# Patient Record
Sex: Female | Born: 1970 | Race: Asian | Hispanic: No | Smoking: Never smoker
Health system: Southern US, Community
[De-identification: ages and names within clinical notes are randomized; demographics above are authoritative.]

## PROBLEM LIST (undated history)

## (undated) ENCOUNTER — Inpatient Hospital Stay (HOSPITAL_COMMUNITY): Payer: Self-pay

## (undated) DIAGNOSIS — Z789 Other specified health status: Secondary | ICD-10-CM

## (undated) HISTORY — PX: OTHER SURGICAL HISTORY: SHX169

---

## 1998-08-26 ENCOUNTER — Emergency Department (HOSPITAL_COMMUNITY): Admission: EM | Admit: 1998-08-26 | Discharge: 1998-08-26 | Payer: Self-pay | Admitting: *Deleted

## 1999-08-10 ENCOUNTER — Inpatient Hospital Stay (HOSPITAL_COMMUNITY): Admission: AD | Admit: 1999-08-10 | Discharge: 1999-08-10 | Payer: Self-pay | Admitting: Obstetrics & Gynecology

## 1999-10-10 ENCOUNTER — Inpatient Hospital Stay (HOSPITAL_COMMUNITY): Admission: AD | Admit: 1999-10-10 | Discharge: 1999-10-10 | Payer: Self-pay | Admitting: Obstetrics

## 2000-01-05 ENCOUNTER — Inpatient Hospital Stay (HOSPITAL_COMMUNITY): Admission: EM | Admit: 2000-01-05 | Discharge: 2000-01-05 | Payer: Self-pay | Admitting: Obstetrics

## 2000-01-21 ENCOUNTER — Other Ambulatory Visit: Admission: RE | Admit: 2000-01-21 | Discharge: 2000-01-21 | Payer: Self-pay | Admitting: Family Medicine

## 2000-01-31 ENCOUNTER — Inpatient Hospital Stay (HOSPITAL_COMMUNITY): Admission: AD | Admit: 2000-01-31 | Discharge: 2000-01-31 | Payer: Self-pay | Admitting: Obstetrics & Gynecology

## 2000-03-01 ENCOUNTER — Other Ambulatory Visit: Admission: RE | Admit: 2000-03-01 | Discharge: 2000-03-01 | Payer: Self-pay | Admitting: *Deleted

## 2000-08-17 ENCOUNTER — Inpatient Hospital Stay (HOSPITAL_COMMUNITY): Admission: AD | Admit: 2000-08-17 | Discharge: 2000-08-19 | Payer: Self-pay | Admitting: Obstetrics and Gynecology

## 2000-12-31 ENCOUNTER — Other Ambulatory Visit: Admission: RE | Admit: 2000-12-31 | Discharge: 2000-12-31 | Payer: Self-pay | Admitting: Obstetrics and Gynecology

## 2001-12-23 ENCOUNTER — Other Ambulatory Visit: Admission: RE | Admit: 2001-12-23 | Discharge: 2001-12-23 | Payer: Self-pay | Admitting: Obstetrics and Gynecology

## 2003-03-09 ENCOUNTER — Other Ambulatory Visit: Admission: RE | Admit: 2003-03-09 | Discharge: 2003-03-09 | Payer: Self-pay | Admitting: Obstetrics and Gynecology

## 2003-11-23 ENCOUNTER — Emergency Department (HOSPITAL_COMMUNITY): Admission: EM | Admit: 2003-11-23 | Discharge: 2003-11-23 | Payer: Self-pay | Admitting: Family Medicine

## 2004-05-08 ENCOUNTER — Other Ambulatory Visit: Admission: RE | Admit: 2004-05-08 | Discharge: 2004-05-08 | Payer: Self-pay | Admitting: Obstetrics and Gynecology

## 2004-06-11 ENCOUNTER — Ambulatory Visit (HOSPITAL_COMMUNITY): Admission: RE | Admit: 2004-06-11 | Discharge: 2004-06-11 | Payer: Self-pay | Admitting: *Deleted

## 2004-08-10 ENCOUNTER — Inpatient Hospital Stay (HOSPITAL_COMMUNITY): Admission: AD | Admit: 2004-08-10 | Discharge: 2004-08-14 | Payer: Self-pay | Admitting: Obstetrics & Gynecology

## 2004-08-10 ENCOUNTER — Ambulatory Visit: Payer: Self-pay | Admitting: Obstetrics & Gynecology

## 2004-08-20 ENCOUNTER — Ambulatory Visit: Payer: Self-pay | Admitting: *Deleted

## 2004-08-26 ENCOUNTER — Inpatient Hospital Stay (HOSPITAL_COMMUNITY): Admission: AD | Admit: 2004-08-26 | Discharge: 2004-08-26 | Payer: Self-pay | Admitting: *Deleted

## 2004-08-29 ENCOUNTER — Inpatient Hospital Stay (HOSPITAL_COMMUNITY): Admission: AD | Admit: 2004-08-29 | Discharge: 2004-08-30 | Payer: Self-pay | Admitting: *Deleted

## 2004-09-03 ENCOUNTER — Ambulatory Visit: Payer: Self-pay | Admitting: *Deleted

## 2004-09-10 ENCOUNTER — Ambulatory Visit: Payer: Self-pay | Admitting: *Deleted

## 2004-09-17 ENCOUNTER — Inpatient Hospital Stay (HOSPITAL_COMMUNITY): Admission: AD | Admit: 2004-09-17 | Discharge: 2004-09-18 | Payer: Self-pay | Admitting: Obstetrics and Gynecology

## 2004-09-17 ENCOUNTER — Ambulatory Visit: Payer: Self-pay | Admitting: *Deleted

## 2004-09-24 ENCOUNTER — Ambulatory Visit: Payer: Self-pay | Admitting: *Deleted

## 2004-10-01 ENCOUNTER — Ambulatory Visit: Payer: Self-pay | Admitting: *Deleted

## 2004-10-08 ENCOUNTER — Ambulatory Visit: Payer: Self-pay | Admitting: Obstetrics & Gynecology

## 2004-10-22 ENCOUNTER — Ambulatory Visit: Payer: Self-pay | Admitting: *Deleted

## 2004-10-29 ENCOUNTER — Ambulatory Visit: Payer: Self-pay | Admitting: *Deleted

## 2004-11-05 ENCOUNTER — Ambulatory Visit: Payer: Self-pay | Admitting: *Deleted

## 2004-11-06 ENCOUNTER — Inpatient Hospital Stay (HOSPITAL_COMMUNITY): Admission: AD | Admit: 2004-11-06 | Discharge: 2004-11-08 | Payer: Self-pay | Admitting: Obstetrics and Gynecology

## 2004-11-06 ENCOUNTER — Ambulatory Visit: Payer: Self-pay | Admitting: Obstetrics and Gynecology

## 2009-08-09 ENCOUNTER — Encounter: Admission: RE | Admit: 2009-08-09 | Discharge: 2009-08-09 | Payer: Self-pay | Admitting: Family Medicine

## 2009-09-05 ENCOUNTER — Ambulatory Visit (HOSPITAL_COMMUNITY): Admission: RE | Admit: 2009-09-05 | Discharge: 2009-09-05 | Payer: Self-pay | Admitting: Gastroenterology

## 2010-02-09 ENCOUNTER — Emergency Department (HOSPITAL_COMMUNITY): Admission: EM | Admit: 2010-02-09 | Discharge: 2010-02-10 | Payer: Self-pay | Admitting: Emergency Medicine

## 2010-03-19 ENCOUNTER — Ambulatory Visit (HOSPITAL_COMMUNITY): Admission: RE | Admit: 2010-03-19 | Discharge: 2010-03-19 | Payer: Self-pay | Admitting: Surgery

## 2010-09-18 LAB — CBC
HCT: 38.9 % (ref 36.0–46.0)
Hemoglobin: 13.2 g/dL (ref 12.0–15.0)
MCH: 23.3 pg — ABNORMAL LOW (ref 26.0–34.0)
MCHC: 33.9 g/dL (ref 30.0–36.0)
MCV: 68.6 fL — ABNORMAL LOW (ref 78.0–100.0)
Platelets: 259 10*3/uL (ref 150–400)
RBC: 5.67 MIL/uL — ABNORMAL HIGH (ref 3.87–5.11)
RDW: 15.4 % (ref 11.5–15.5)
WBC: 7.9 10*3/uL (ref 4.0–10.5)

## 2010-09-18 LAB — COMPREHENSIVE METABOLIC PANEL
ALT: 18 U/L (ref 0–35)
AST: 18 U/L (ref 0–37)
Albumin: 4.1 g/dL (ref 3.5–5.2)
Alkaline Phosphatase: 39 U/L (ref 39–117)
BUN: 6 mg/dL (ref 6–23)
CO2: 27 mEq/L (ref 19–32)
Calcium: 9.5 mg/dL (ref 8.4–10.5)
Chloride: 106 mEq/L (ref 96–112)
Creatinine, Ser: 0.56 mg/dL (ref 0.4–1.2)
GFR calc Af Amer: >60 mL/min (ref >60)
GFR calc non Af Amer: >60 mL/min (ref >60)
Glucose, Bld: 83 mg/dL (ref 70–99)
Potassium: 4 mEq/L (ref 3.5–5.1)
Sodium: 139 mEq/L (ref 135–145)
Total Bilirubin: 1 mg/dL (ref 0.3–1.2)
Total Protein: 7 g/dL (ref 6.0–8.3)

## 2010-09-18 LAB — SURGICAL PCR SCREEN
MRSA, PCR: NEGATIVE
Staphylococcus aureus: NEGATIVE

## 2010-09-19 LAB — CBC
HCT: 34.7 % — ABNORMAL LOW (ref 36.0–46.0)
Hemoglobin: 11.3 g/dL — ABNORMAL LOW (ref 12.0–15.0)
MCH: 22.4 pg — ABNORMAL LOW (ref 26.0–34.0)
MCHC: 32.6 g/dL (ref 30.0–36.0)
MCV: 68.8 fL — ABNORMAL LOW (ref 78.0–100.0)
Platelets: 271 10*3/uL (ref 150–400)
RBC: 5.04 MIL/uL (ref 3.87–5.11)
RDW: 15.4 % (ref 11.5–15.5)
WBC: 8.3 10*3/uL (ref 4.0–10.5)

## 2010-09-19 LAB — COMPREHENSIVE METABOLIC PANEL
ALT: 27 U/L (ref 0–35)
AST: 28 U/L (ref 0–37)
Albumin: 3.7 g/dL (ref 3.5–5.2)
Alkaline Phosphatase: 30 U/L — ABNORMAL LOW (ref 39–117)
BUN: 8 mg/dL (ref 6–23)
CO2: 27 mEq/L (ref 19–32)
Calcium: 8.8 mg/dL (ref 8.4–10.5)
Chloride: 107 mEq/L (ref 96–112)
Creatinine, Ser: 0.71 mg/dL (ref 0.4–1.2)
GFR calc Af Amer: >60 mL/min (ref >60)
GFR calc non Af Amer: >60 mL/min (ref >60)
Glucose, Bld: 99 mg/dL (ref 70–99)
Potassium: 3.2 mEq/L — ABNORMAL LOW (ref 3.5–5.1)
Sodium: 139 mEq/L (ref 135–145)
Total Bilirubin: 0.4 mg/dL (ref 0.3–1.2)
Total Protein: 6.7 g/dL (ref 6.0–8.3)

## 2010-09-19 LAB — DIFFERENTIAL
Basophils Absolute: 0 10*3/uL (ref 0.0–0.1)
Basophils Relative: 0 % (ref 0–1)
Eosinophils Absolute: 0.1 10*3/uL (ref 0.0–0.7)
Eosinophils Relative: 1 % (ref 0–5)
Lymphocytes Relative: 38 % (ref 12–46)
Lymphs Abs: 3.2 10*3/uL (ref 0.7–4.0)
Monocytes Absolute: 0.7 10*3/uL (ref 0.1–1.0)
Monocytes Relative: 8 % (ref 3–12)
Neutro Abs: 4.3 10*3/uL (ref 1.7–7.7)
Neutrophils Relative %: 53 % (ref 43–77)

## 2010-09-19 LAB — URINE MICROSCOPIC-ADD ON

## 2010-09-19 LAB — URINALYSIS, ROUTINE W REFLEX MICROSCOPIC
Bilirubin Urine: NEGATIVE
Glucose, UA: NEGATIVE mg/dL
Hgb urine dipstick: NEGATIVE
Ketones, ur: NEGATIVE mg/dL
Leukocytes, UA: NEGATIVE
Nitrite: NEGATIVE
Protein, ur: NEGATIVE mg/dL
Specific Gravity, Urine: 1.019 (ref 1.005–1.030)
Urobilinogen, UA: 1 mg/dL (ref 0.0–1.0)
pH: 7.5 (ref 5.0–8.0)

## 2010-09-19 LAB — POCT PREGNANCY, URINE: Preg Test, Ur: NEGATIVE

## 2010-09-19 LAB — LIPASE, BLOOD: Lipase: 30 U/L (ref 11–59)

## 2010-11-10 ENCOUNTER — Other Ambulatory Visit: Payer: Self-pay | Admitting: Family Medicine

## 2010-11-10 DIAGNOSIS — N644 Mastodynia: Secondary | ICD-10-CM

## 2010-11-13 ENCOUNTER — Ambulatory Visit
Admission: RE | Admit: 2010-11-13 | Discharge: 2010-11-13 | Disposition: A | Discharge status: Home / Self Care | Payer: Self-pay | Source: Ambulatory Visit | Attending: Family Medicine | Admitting: Family Medicine

## 2010-11-13 DIAGNOSIS — N644 Mastodynia: Secondary | ICD-10-CM

## 2010-11-21 NOTE — H&P (Signed)
[facility]  Patient:    Carolyn Bryan, Carolyn Bryan                         MRN: [id number] Adm. Date:  [id number] Attending:  Shaune Spittle Dictator:   Wynelle Bourgeois, CNM                         History and Physical  HISTORY OF PRESENT ILLNESS:   Ms. Lucente is a 40 year old G4, para 2-0-1-2 at 39+ weeks who presents to MAU with complaints of one hours duration of strong uterine contractions every few minutes.  Upon exam in MAU, she was found to be 9-cm dilated and was transferred to the LDR.  She denied any leaking and reported some bloody show and positive fetal movement.  Pregnancy has been followed by the M.D. service at [facility] and has been remarkable for: #1 - Previous C-section with subsequent VBAC x 1, #2 - unknown LMP, #3 - first trimester bleeding, #4 - group B strep negative, #5 - late to care.  OBSTETRICAL HISTORY:          Her OB history is remarkable for a C-section in March of 1992 of a female infant under general anesthesia at [redacted] weeks gestation secondary to cephalopelvic disproportion.  She had a vaginal birth after cesarean section in July of 1994 of a female at [redacted] weeks gestation with no complications.  She had a spontaneous abortion in 1998 at [redacted] weeks gestation with no complications.  PRENATAL LABORATORY DATA:     Hemoglobin 11.4, hematocrit 36.4, platelets 283,000.  Blood type A positive.  Antibody screen negative.  Sickle cell negative.  RPR nonreactive.  Rubella immune.  HBsAg negative.  Pap test normal.  Gonorrhea negative.  Chlamydia negative.  Glucose challenge within normal limits.  Group B strep negative.  MEDICAL HISTORY:              Her medical history is remarkable for anemia in 1992, a mole removal on her right breast in 1991, occasional yeast infections and history of varicella at age 28.  FAMILY HISTORY:               She has no knowledge of her family history due to being raised in an orphanage.  GENETIC HISTORY:               Her genetic history is unavailable secondary to unknown history of patients heritage and the unknown history of the father of the baby.  SOCIAL HISTORY:               Patient is separated but involved with a young man named Yetta Barre who is involved and supportive.  She does not report a religious affiliation.  She denies any alcohol, tobacco or drug use.  PHYSICAL EXAMINATION  VITAL SIGNS:                  Stable.  Afebrile.  NEUROLOGIC:                   Intact.  HEENT:                        Within normal limits.  NECK:                         Thyroid normal, not enlarged.  BREASTS:  Soft, nontender.  No masses.  CARDIOVASCULAR:               Regular rate and rhythm.  RESPIRATORY:                  Clear to auscultation bilaterally.  ABDOMEN:                      Gravid.  Fetal monitor denotes fetal heart rate in the 120s with variable decelerations to the 80s with contractions.  Uterine contractions every one to two minutes.  PELVIC:                       Cervical exam on admission was 9-cm dilated, 100% effaced and 0 station with a vertex presentation.  EXTREMITIES:                  Within normal limits.  ASSESSMENT:                   1. Intrauterine pregnancy at 39+ weeks.                               2. Active labor, now delivered status post                                  vaginal delivery by Dr. Maris Berger. Haygood of                                  a viable female infant named [place], Apgars                                  9/9, weight unknown at this time.                               3. Group B streptococcus negative.                               4. Previous cesarean section with vaginal birth                                  after cesarean section x 2.  PLAN:                         1. Admit to birthing suites per Dr. Pennie Rushing.                               2. Routine M.D. orders, routine postpartum                                   orders.                               3. Transfer to mother/baby unit when stable. DD:  08/17/00 TD:  08/17/00 Job: [id number]

## 2010-11-21 NOTE — Discharge Summary (Signed)
[name], Bryan                  ACCOUNT NO.:  [id number]   MEDICAL RECORD NO.:  [id number]          PATIENT TYPE:  INP   LOCATION:  9154                          FACILITY:  [facility]   PHYSICIAN:  Lesly Dukes, M.D. DATE OF BIRTH:  [birth year]   DATE OF ADMISSION:  08/10/2004  DATE OF DISCHARGE:  08/14/2004                                 DISCHARGE SUMMARY   ADMISSION DIAGNOSES:  43.  A 40 year old G6, P3-0-2-3 at 26-2/7 weeks with decreased fetal movement      and cramping pain.  2.  Cervical change to a loose 1, 50% with developed lower uterine segment.  3.  History of cesarean section with two vaginal births after the cesarean      section, all at full term.   DISCHARGE DIAGNOSES:  29.  A 40 year old, G6, P3-0-2-3 at 27-1/7 weeks with stable preterm cervical      change.  2.  Cervical exam of 1 cm, approximately 2.5 cm long, and -2.  3.  Reassuring fetal heart tracing.  4.  Elevated one hour Glucola.   DISCHARGE MEDICATIONS:  1.  Procardia XL 30 mg one p.o. b.i.d.  2.  Prenatal vitamin one p.o. q day.   ADMISSION HISTORY:  [name] was admitted to [facility] with preterm  cervical change and some cramping.  She was started on magnesium until her  betamethasone x2 doses was given.  The magnesium was discontinued and the  patient was changed to Procardia on the day prior to discharge.  She was  stable throughout her hospitalization as far as contractions or cervical  change.  On the day of discharge, her cervix had made no change.  She  received five days of Unasyn for GBS positive status.  Her fetal fibronectin  was negative.   A one-hour Glucola was done prior to betamethasone.  It was elevated at 150.  She was placed on an ADA diet and her sugars were well controlled with a.m.  sugars of 90 and two hours postprandial between 90-120.  She was arranged  for a three hours GTT after discharge and was told of the appropriate diet  as well as to fast prior to the exam.   The patient was discharged to home in stable condition.  She will [facility] one [facility].   INSTRUCTIONS GIVEN TO THE PATIENT:  The patient was told of the above  medical regimen.  She was given the appropriate diet instructions prior to  her three hours GTT and was told to fast after midnight before the test.  She was given the address for [facility].  She was given preterm labor  precautions as well.    LC/MEDQ  D:  08/14/2004  T:  08/14/2004  Job:  [id number]   cc:   [facility]

## 2010-11-21 NOTE — Discharge Summary (Signed)
NAMEWING, GFELLER                  ACCOUNT NO.:  [id number]   MEDICAL RECORD NO.:  [id number]          PATIENT TYPE:  WOC   LOCATION:  WOC                          FACILITY:  [facility]   PHYSICIAN:  Conni Elliot, M.D.DATE OF BIRTH:  07-19-1970   DATE OF ADMISSION:  09/17/2004  DATE OF DISCHARGE:  09/18/2004                                 DISCHARGE SUMMARY   ADMISSION DIAGNOSES:  76.  40 year old, G6, P3-0-2-3 at 1 and 2 weeks with preterm cervical      change.  2.  Reassuring fetal heart tracing.   DISCHARGE DIAGNOSES:  2.  40 year old, G6, P3-0-2-3 at 21 and 3 with stable cervical exam.  2.  Reassuring fetal heart tracing.   DISCHARGE MEDICATIONS:  1.  Procardia XL 30 mg p.o. b.i.d.  2.  Prenatal vitamins 1 p.o. q.d.   ADMISSION HISTORY:  Ms. Tonnesen was [facility] from [facility] for cervical  change. She was noted to be fingertip a week prior to admission which was  improved from a cervical exam of 2 cm during her last admission. On day of  admission, she was again 2 cm in dilation.   HOSPITAL COURSE:  The patient remained stable overnight. She was continued  on Procardia and started on Unasyn in case she labored. She was given  betamethasone during her last hospitalization. Throughout the hospital stay,  the fetal heart tracing remained reassuring. On her evening of discharge,  her cervix was 2, 50 and -2 and vertex which was no significant change from  the day prior.   CONDITION ON DISCHARGE:  The patient was discharged to home in stable  condition.   DISCHARGE INSTRUCTIONS:  The patient was told of the above medical regimen.  She should followup with [facility] at her next scheduled appointment  or next week. She does have a GBS, gonorrhea and chlamydia that are pending  on discharge and these will be followed up as an outpatient.      LC/MEDQ  D:  09/18/2004  T:  09/18/2004  Job:  [id number]

## 2012-07-20 ENCOUNTER — Other Ambulatory Visit (HOSPITAL_COMMUNITY): Payer: Self-pay | Admitting: Family Medicine

## 2012-07-20 DIAGNOSIS — Z1231 Encounter for screening mammogram for malignant neoplasm of breast: Secondary | ICD-10-CM

## 2012-08-01 ENCOUNTER — Ambulatory Visit (HOSPITAL_COMMUNITY)
Admission: RE | Admit: 2012-08-01 | Discharge: 2012-08-01 | Disposition: A | Discharge status: Home / Self Care | Payer: Self-pay | Source: Ambulatory Visit | Attending: Family Medicine | Admitting: Family Medicine

## 2012-08-01 DIAGNOSIS — Z1231 Encounter for screening mammogram for malignant neoplasm of breast: Secondary | ICD-10-CM

## 2012-09-19 ENCOUNTER — Encounter: Payer: Self-pay | Admitting: Family Medicine

## 2012-09-19 ENCOUNTER — Encounter: Payer: Self-pay | Admitting: Obstetrics and Gynecology

## 2012-09-19 ENCOUNTER — Ambulatory Visit: Payer: Self-pay | Admitting: Family Medicine

## 2012-09-19 VITALS — BP 130/64 | Ht 62.0 in | Wt 179.0 lb

## 2012-09-19 DIAGNOSIS — IMO0002 Reserved for concepts with insufficient information to code with codable children: Secondary | ICD-10-CM | POA: Insufficient documentation

## 2012-09-19 DIAGNOSIS — Z139 Encounter for screening, unspecified: Secondary | ICD-10-CM

## 2012-09-19 DIAGNOSIS — Z3201 Encounter for pregnancy test, result positive: Secondary | ICD-10-CM

## 2012-09-19 DIAGNOSIS — O09521 Supervision of elderly multigravida, first trimester: Secondary | ICD-10-CM

## 2012-09-19 LAB — POCT URINE PREGNANCY: Preg Test, Ur: POSITIVE

## 2012-09-19 NOTE — Progress Notes (Signed)
BP 130/64  Ht 5\' 2"  (1.575 m)  Wt 179 lb (81.194 kg)  BMI 32.73 kg/m2  LMP 08/03/2012  S: Pt presents with amenorrhea and 2 postive home UPTs.  She requests a form for medicaid stating she is pregnant.  ROS negative.  PMH, SH, FH reviewed.  G6P4 with 2 SABS.   A: +UPT      71w6days  P: Schedule NOB visit.      Prenatal Vitamin 1 PO daily      Discussed with patient importance of avoiding Cat liter, soft cheeses, and cooking lunch meats.      Declined Flu shot today.   Lynnell Jude, FNP-BC

## 2012-09-22 ENCOUNTER — Other Ambulatory Visit: Payer: Self-pay | Admitting: Certified Nurse Midwife

## 2012-09-23 LAB — PRENATAL PANEL VII
Antibody Screen: NEGATIVE
Basophils Absolute: 0 10*3/uL (ref 0.0–0.1)
Basophils Relative: 0 % (ref 0–1)
Eosinophils Absolute: 0 10*3/uL (ref 0.0–0.7)
Eosinophils Relative: 0 % (ref 0–5)
HIV: NONREACTIVE
Hemoglobin: 12.2 g/dL (ref 12.0–15.0)
Hepatitis B Surface Ag: NEGATIVE
Lymphs Abs: 2.3 10*3/uL (ref 0.7–4.0)
MCH: 22 pg — ABNORMAL LOW (ref 26.0–34.0)
MCHC: 33.3 g/dL (ref 30.0–36.0)
MCV: 65.9 fL — ABNORMAL LOW (ref 78.0–100.0)
Monocytes Relative: 6 % (ref 3–12)
Neutro Abs: 7 10*3/uL (ref 1.7–7.7)
Neutrophils Relative %: 71 % (ref 43–77)
Platelets: 331 10*3/uL (ref 150–400)
RBC: 5.55 MIL/uL — ABNORMAL HIGH (ref 3.87–5.11)
RDW: 17.6 % — ABNORMAL HIGH (ref 11.5–15.5)
Rh Type: POSITIVE
Rubella: 8.65 Index — ABNORMAL HIGH (ref <0.90)

## 2012-09-23 LAB — GC/CHLAMYDIA PROBE AMP, URINE
Chlamydia, Swab/Urine, PCR: NEGATIVE
GC Probe Amp, Urine: NEGATIVE

## 2012-09-24 LAB — CULTURE, OB URINE: Colony Count: >=100000

## 2012-10-20 ENCOUNTER — Inpatient Hospital Stay (HOSPITAL_COMMUNITY): Payer: Medicaid Other | Admitting: Anesthesiology

## 2012-10-20 ENCOUNTER — Encounter (HOSPITAL_COMMUNITY): Payer: Self-pay | Admitting: Anesthesiology

## 2012-10-20 ENCOUNTER — Encounter (HOSPITAL_COMMUNITY)
Admission: AD | Disposition: A | Discharge status: Home / Self Care | Payer: Self-pay | Source: Ambulatory Visit | Attending: Obstetrics and Gynecology

## 2012-10-20 ENCOUNTER — Observation Stay (HOSPITAL_COMMUNITY)
Admission: AD | Admit: 2012-10-20 | Discharge: 2012-10-21 | Disposition: A | Discharge status: Home / Self Care | Payer: Medicaid Other | Source: Ambulatory Visit | Attending: Obstetrics and Gynecology | Admitting: Obstetrics and Gynecology

## 2012-10-20 ENCOUNTER — Encounter (HOSPITAL_COMMUNITY): Payer: Self-pay | Admitting: *Deleted

## 2012-10-20 ENCOUNTER — Ambulatory Visit: Admit: 2012-10-20 | Payer: Self-pay | Admitting: Obstetrics and Gynecology

## 2012-10-20 DIAGNOSIS — O034 Incomplete spontaneous abortion without complication: Secondary | ICD-10-CM

## 2012-10-20 DIAGNOSIS — O021 Missed abortion: Principal | ICD-10-CM | POA: Insufficient documentation

## 2012-10-20 HISTORY — PX: DILATION AND EVACUATION: SHX1459

## 2012-10-20 HISTORY — DX: Other specified health status: Z78.9

## 2012-10-20 LAB — CBC
HCT: 35.6 % — ABNORMAL LOW (ref 36.0–46.0)
HCT: 25.4 % — ABNORMAL LOW (ref 36.0–46.0)
Hemoglobin: 12.1 g/dL (ref 12.0–15.0)
Hemoglobin: 8.3 g/dL — ABNORMAL LOW (ref 12.0–15.0)
MCH: 23.1 pg — ABNORMAL LOW (ref 26.0–34.0)
MCH: 22.4 pg — ABNORMAL LOW (ref 26.0–34.0)
MCH: 23.3 pg — ABNORMAL LOW (ref 26.0–34.0)
MCHC: 34 g/dL (ref 30.0–36.0)
MCV: 67.9 fL — ABNORMAL LOW (ref 78.0–100.0)
MCV: 68.6 fL — ABNORMAL LOW (ref 78.0–100.0)
MCV: 70 fL — ABNORMAL LOW (ref 78.0–100.0)
Platelets: 307 10*3/uL (ref 150–400)
Platelets: 222 10*3/uL (ref 150–400)
RBC: 5.24 MIL/uL — ABNORMAL HIGH (ref 3.87–5.11)
RBC: 3.7 MIL/uL — ABNORMAL LOW (ref 3.87–5.11)
RDW: 16.2 % — ABNORMAL HIGH (ref 11.5–15.5)
RDW: 16.5 % — ABNORMAL HIGH (ref 11.5–15.5)
WBC: 9 10*3/uL (ref 4.0–10.5)

## 2012-10-20 SURGERY — DILATION AND EVACUATION, UTERUS
Anesthesia: General | Site: Vagina | Wound class: Clean Contaminated

## 2012-10-20 MED ORDER — ONDANSETRON HCL 4 MG/2ML IJ SOLN
INTRAMUSCULAR | Status: DC | PRN
  Administered 2012-10-20: 4 mg via INTRAVENOUS

## 2012-10-20 MED ORDER — PROPOFOL 10 MG/ML IV EMUL
INTRAVENOUS | Status: DC | PRN
  Administered 2012-10-20: 200 mg via INTRAVENOUS

## 2012-10-20 MED ORDER — CITRIC ACID-SODIUM CITRATE 334-500 MG/5ML PO SOLN
30.0000 mL | Freq: Once | ORAL | Status: AC
  Administered 2012-10-20: 30 mL via ORAL
  Filled 2012-10-20: qty 15

## 2012-10-20 MED ORDER — KETOROLAC TROMETHAMINE 30 MG/ML IJ SOLN
INTRAMUSCULAR | Status: DC | PRN
  Administered 2012-10-20: 30 mg via INTRAVENOUS

## 2012-10-20 MED ORDER — KETOROLAC TROMETHAMINE 60 MG/2ML IM SOLN
INTRAMUSCULAR | Status: DC | PRN
  Administered 2012-10-20: 30 mg via INTRAMUSCULAR

## 2012-10-20 MED ORDER — IBUPROFEN 800 MG PO TABS: 800.0000 mg | ORAL_TABLET | Freq: Three times a day (TID) | ORAL | Status: DC | PRN

## 2012-10-20 MED ORDER — ONDANSETRON HCL 4 MG PO TABS: 4.0000 mg | ORAL_TABLET | Freq: Four times a day (QID) | ORAL | Status: DC | PRN

## 2012-10-20 MED ORDER — SIMETHICONE 80 MG PO CHEW: 80.0000 mg | CHEWABLE_TABLET | Freq: Four times a day (QID) | ORAL | Status: DC | PRN

## 2012-10-20 MED ORDER — LACTATED RINGERS IV SOLN: INTRAVENOUS | Status: DC

## 2012-10-20 MED ORDER — IBUPROFEN 800 MG PO TABS
800.0000 mg | ORAL_TABLET | Freq: Three times a day (TID) | ORAL | Status: DC
  Administered 2012-10-20 – 2012-10-21 (×3): 800 mg via ORAL
  Filled 2012-10-20 (×3): qty 1

## 2012-10-20 MED ORDER — FENTANYL CITRATE 0.05 MG/ML IJ SOLN
25.0000 ug | INTRAMUSCULAR | Status: DC | PRN
  Administered 2012-10-20 (×2): 50 ug via INTRAVENOUS

## 2012-10-20 MED ORDER — DEXTROSE-NACL 5-0.45 % IV SOLN
INTRAVENOUS | Status: DC
  Administered 2012-10-20: 21:00:00 via INTRAVENOUS

## 2012-10-20 MED ORDER — ACETAMINOPHEN 325 MG PO TABS: 650.0000 mg | ORAL_TABLET | ORAL | Status: DC | PRN

## 2012-10-20 MED ORDER — HYDROCODONE-ACETAMINOPHEN 5-325 MG PO TABS
1.0000 | ORAL_TABLET | ORAL | Status: DC | PRN
  Administered 2012-10-20 – 2012-10-21 (×2): 1 via ORAL
  Filled 2012-10-20 (×2): qty 1

## 2012-10-20 MED ORDER — FENTANYL CITRATE 0.05 MG/ML IJ SOLN
INTRAMUSCULAR | Status: DC | PRN
  Administered 2012-10-20: 100 ug via INTRAVENOUS

## 2012-10-20 MED ORDER — ACETAMINOPHEN 10 MG/ML IV SOLN
INTRAVENOUS | Status: AC
  Administered 2012-10-20: 1000 mg via INTRAVENOUS
  Filled 2012-10-20: qty 100

## 2012-10-20 MED ORDER — BUPIVACAINE-EPINEPHRINE 0.5% -1:200000 IJ SOLN
INTRAMUSCULAR | Status: DC | PRN
  Administered 2012-10-20: 10 mL

## 2012-10-20 MED ORDER — SODIUM CHLORIDE 0.9 % IV SOLN
INTRAVENOUS | Status: DC
  Administered 2012-10-20: 15:00:00 via INTRAVENOUS

## 2012-10-20 MED ORDER — ONDANSETRON HCL 4 MG/2ML IJ SOLN: 4.0000 mg | Freq: Four times a day (QID) | INTRAMUSCULAR | Status: DC | PRN

## 2012-10-20 MED ORDER — FENTANYL CITRATE 0.05 MG/ML IJ SOLN
INTRAMUSCULAR | Status: AC
  Administered 2012-10-20: 50 ug via INTRAVENOUS
  Filled 2012-10-20: qty 2

## 2012-10-20 MED ORDER — FERROUS SULFATE 325 (65 FE) MG PO TABS: 325.0000 mg | ORAL_TABLET | Freq: Two times a day (BID) | ORAL | Status: DC

## 2012-10-20 MED ORDER — DEXAMETHASONE SODIUM PHOSPHATE 4 MG/ML IJ SOLN
INTRAMUSCULAR | Status: DC | PRN
  Administered 2012-10-20: 10 mg via INTRAVENOUS

## 2012-10-20 MED ORDER — METOCLOPRAMIDE HCL 5 MG/ML IJ SOLN: 10.0000 mg | Freq: Once | INTRAMUSCULAR | Status: DC | PRN

## 2012-10-20 MED ORDER — PRENATAL MULTIVITAMIN CH
1.0000 | ORAL_TABLET | Freq: Every day | ORAL | Status: DC
  Administered 2012-10-21: 1 via ORAL
  Filled 2012-10-20 (×3): qty 1

## 2012-10-20 MED ORDER — OXYCODONE-ACETAMINOPHEN 5-325 MG PO TABS: 1.0000 | ORAL_TABLET | ORAL | Status: DC | PRN

## 2012-10-20 MED ORDER — MEPERIDINE HCL 25 MG/ML IJ SOLN: 6.2500 mg | INTRAMUSCULAR | Status: DC | PRN

## 2012-10-20 MED ORDER — ACETAMINOPHEN 10 MG/ML IV SOLN: 1000.0000 mg | Freq: Four times a day (QID) | INTRAVENOUS | Status: DC

## 2012-10-20 MED ORDER — MIDAZOLAM HCL 5 MG/5ML IJ SOLN
INTRAMUSCULAR | Status: DC | PRN
  Administered 2012-10-20: 2 mg via INTRAVENOUS

## 2012-10-20 MED ORDER — FAMOTIDINE IN NACL 20-0.9 MG/50ML-% IV SOLN
20.0000 mg | Freq: Once | INTRAVENOUS | Status: AC
  Administered 2012-10-20: 20 mg via INTRAVENOUS
  Filled 2012-10-20: qty 50

## 2012-10-20 MED ORDER — GUAIFENESIN 100 MG/5ML PO SOLN
15.0000 mL | ORAL | Status: DC | PRN
  Filled 2012-10-20: qty 15

## 2012-10-20 MED ORDER — LACTATED RINGERS IV BOLUS (SEPSIS)
1000.0000 mL | Freq: Once | INTRAVENOUS | Status: AC
  Administered 2012-10-20: 800 mL via INTRAVENOUS
  Administered 2012-10-20: 1000 mL via INTRAVENOUS

## 2012-10-20 MED ORDER — MENTHOL 3 MG MT LOZG
1.0000 | LOZENGE | OROMUCOSAL | Status: DC | PRN
  Filled 2012-10-20: qty 9

## 2012-10-20 MED ORDER — 0.9 % SODIUM CHLORIDE (POUR BTL) OPTIME
TOPICAL | Status: DC | PRN
  Administered 2012-10-20: 1000 mL

## 2012-10-20 MED ORDER — LACTATED RINGERS IV SOLN
INTRAVENOUS | Status: DC | PRN
  Administered 2012-10-20 (×2): via INTRAVENOUS

## 2012-10-20 SURGICAL SUPPLY — 21 items
CATH ROBINSON RED A/P 16FR (CATHETERS) ×2 IMPLANT
CLOTH BEACON ORANGE TIMEOUT ST (SAFETY) ×2 IMPLANT
DECANTER SPIKE VIAL GLASS SM (MISCELLANEOUS) ×2 IMPLANT
GLOVE BIOGEL PI IND STRL 8.5 (GLOVE) ×1 IMPLANT
GLOVE BIOGEL PI INDICATOR 8.5 (GLOVE) ×1
GLOVE ECLIPSE 8.0 STRL XLNG CF (GLOVE) ×4 IMPLANT
GOWN STRL REIN XL XLG (GOWN DISPOSABLE) ×4 IMPLANT
KIT BERKELEY 1ST TRIMESTER 3/8 (MISCELLANEOUS) ×2 IMPLANT
NDL SPNL 22GX3.5 QUINCKE BK (NEEDLE) ×1 IMPLANT
NEEDLE SPNL 22GX3.5 QUINCKE BK (NEEDLE) ×2 IMPLANT
NS IRRIG 1000ML POUR BTL (IV SOLUTION) ×2 IMPLANT
PACK VAGINAL MINOR WOMEN LF (CUSTOM PROCEDURE TRAY) ×2 IMPLANT
PAD OB MATERNITY 4.3X12.25 (PERSONAL CARE ITEMS) ×2 IMPLANT
PAD PREP 24X48 CUFFED NSTRL (MISCELLANEOUS) ×2 IMPLANT
SET BERKELEY SUCTION TUBING (SUCTIONS) ×2 IMPLANT
SYR CONTROL 10ML LL (SYRINGE) ×2 IMPLANT
TOWEL OR 17X24 6PK STRL BLUE (TOWEL DISPOSABLE) ×4 IMPLANT
VACURETTE 10 RIGID CVD (CANNULA) IMPLANT
VACURETTE 7MM CVD STRL WRAP (CANNULA) IMPLANT
VACURETTE 8 RIGID CVD (CANNULA) ×1 IMPLANT
VACURETTE 9 RIGID CVD (CANNULA) IMPLANT

## 2012-10-20 NOTE — MAU Note (Signed)
Pt brought in by EMS, heavy vaginal bleeding, pt states she was told 2 weeks ago that she was having a miscarriage.  Pt of [facility]

## 2012-10-20 NOTE — MAU Note (Signed)
Report from EMS: pt told was miscarriage a couple wks ago, had had some spotting.  Pt started gushing around 0200, was going to see MD, EMS picked her up in parking lot.  Heavy bleeding noted.

## 2012-10-20 NOTE — Op Note (Signed)
OPERATIVE NOTE  Carolyn Bryan  DOB:    [birth year]  MRN:    [id number]  CSN:    [id number]  Date of Surgery:  10/20/2012  Preoperative Diagnosis:  Missed Abortion in the first trimester  Heavy vaginal bleeding  Postoperative Diagnosis:  Missed Abortion in the first trimester  Heavy vaginal bleeding  Procedure:  FIRST TRIMESTER SUCTION DILATATION AND CURETTAGE  Surgeon:  Leonard Schwartz, M.D.  Assistant:  None  Anesthetic:  General  Disposition:  The patient is a 42 y.o. year old female who presents with a nonviable gestation based on ultrasound and/or quantitative hCG values. She began having heavy vaginal bleeding this morning. She has lost an estimated 1000 cc of blood so far. She understands the indications for her surgical procedure as well as the alternative treatment options. She accepts the risk of, but not limited to, anesthetic complications, bleeding, infections, and possible damage to the surrounding organs.  Findings:  The patient's blood type is A+. A moderate amount of products of conception were removed from within a 8 weeks size uterus. No adnexal masses were appreciated.  Procedure:  The patient was taken to the operating room where a General anesthetic was given. The patient's abdomen, perineum, and vagina were prepped with Betadine. The bladder was drained of urine. The patient was sterilely draped. Examination under anesthesia was performed. A paracervical block was placed using 10 cc of half percent Marcaine with epinephrine. The uterus sounded to 9 centimeters. The cervix was gently dilated. The uterine cavity was evacuated using a size 8 suction curet. The cavity was then cleaned using a medium sharp curet. The cavity was felt to be clean at the end of our procedure. The estimated blood loss was 400 cc's. The uterus was reexamined and was noted to be firm. Hemostasis was adequate. Sponge and needle counts were correct. The patient tolerated  her but her procedure well. She was awakened from her anesthetic without difficulty. She was transported to the recovery room in stable condition. The products of conception were sent to pathology.  Followup instructions:  The patient return to see Dr. Stefano Gaul in 2 weeks. She was given prescriptions for pain medications. She was given instructions for patients who've undergone the surgical procedure. She will call for questions or concerns.  Leonard Schwartz, M.D.

## 2012-10-20 NOTE — Anesthesia Postprocedure Evaluation (Addendum)
  Anesthesia Post-op Note  Patient: Carolyn Bryan  Procedure(s) Performed: Procedure(s): DILATATION AND EVACUATION (N/A)  Patient Location: PACU  Anesthesia Type:General  Level of Consciousness: awake, alert  and oriented  Airway and Oxygen Therapy: Patient Spontanous Breathing  Post-op Pain: none  Post-op Assessment: Post-op Vital signs reviewed, Patient's Cardiovascular Status Stable, Respiratory Function Stable, Patent Airway, No signs of Nausea or vomiting and Pain level controlled. Patient developed syncopal episode in PACU associated with an episode of hypotension. She responded to IVF and ephedrine. Will be transfused PRBC per Dr. Stefano Gaul. He will admit for overnight observation.  Post-op Vital Signs: Reviewed and stable  Complications: No apparent anesthesia complications

## 2012-10-20 NOTE — Transfer of Care (Signed)
Immediate Anesthesia Transfer of Care Note  Patient: Carolyn Bryan  Procedure(s) Performed: Procedure(s): DILATATION AND EVACUATION (N/A)  Patient Location: PACU  Anesthesia Type:General  Level of Consciousness: awake, alert  and oriented  Airway & Oxygen Therapy: Patient Spontanous Breathing and Patient connected to nasal cannula oxygen  Post-op Assessment: Report given to PACU RN and Post -op Vital signs reviewed and stable  Post vital signs: Reviewed and stable  Complications: No apparent anesthesia complications

## 2012-10-20 NOTE — H&P (Addendum)
Admission History and Physical Exam for a Gynecology Patient  [name] is a 42 y.o. female, [id number], who presents for D and E because of a Missed Ab and heavy bleeding. She has been followed at the [facility] and Gynecology division of [facility]. A prior US showed a nonviable gestation.  OB History   Grav Para Term Preterm Abortions TAB SAB Ect Mult Living   7 4 4  2  2   4       Past Medical History  Diagnosis Date  . Medical history non-contributory     Prescriptions prior to admission  Medication Sig Dispense Refill  . acetaminophen (TYLENOL) 325 MG tablet Take 325 mg by mouth every 6 (six) hours as needed for pain.        Past Surgical History  Procedure Laterality Date  . Cesarean section    . Wisdom teeth abstracted    . Gall bladder removed      No Known Allergies  Family History: family history is not on file.  Social History:  reports that she has never smoked. She does not have any smokeless tobacco history on file. She reports that she does not drink alcohol or use illicit drugs.  Review of systems: See HPI.  Admission Physical Exam:    There is no weight on file to calculate BMI.  Blood pressure 126/80, pulse 84, temperature 97.2 F (36.2 C), temperature source Oral, resp. rate 22, last menstrual period 08/03/2012, SpO2 100.00%.  HEENT:                 Within normal limits Chest:                   Clear Heart:                    Regular rate and rhythm Breasts:                No masses, skin changes, bleeding, or discharge present Abdomen:             Nontender, no masses Extremities:          Grossly normal Neurologic exam: Grossly normal  Pelvic exam:  External genitalia: normal general appearance Vaginal: normal without tenderness, induration or masses Cervix:  slightly open, heavy bleeding. Adnexa: normal bimanual exam Uterus: tender and enlarged  CBC    Component Value Date/Time   WBC 9.0  10/20/2012 1016   RBC 5.24* 10/20/2012 1016   HGB 12.1 10/20/2012 1016   HCT 35.6* 10/20/2012 1016   PLT 307 10/20/2012 1016   MCV 67.9* 10/20/2012 1016   MCH 23.1* 10/20/2012 1016   MCHC 34.0 10/20/2012 1016   RDW 16.2* 10/20/2012 1016   LYMPHSABS 2.3 09/22/2012 1121   MONOABS 0.6 09/22/2012 1121   EOSABS 0.0 09/22/2012 1121   BASOSABS 0.0 09/22/2012 1121   Blood Type: A +  GC/Chl: Neg/Neg   Assessment:  Missed Ab  Heavy bleeding  Plan:  D and E. EBL 1000 cc's so far. R and B reviewed. Permit signed.   Janine Limbo 10/20/2012

## 2012-10-20 NOTE — Progress Notes (Signed)
S: Called to MAU urgently due to pt w heavy vaginal bleeding having miscarriage, pt arrived via EMS  Upon my arrival to [facility], RN was preparing to begin IV line and MAU provider was at [facility] (just beginning MSE)   Pt states she was at the parking garage at [employer] office (had not been into office yet for her appt) and began bleeding heavily Has had some "spotting" and had [place] on 4-10 in office that showed missed AB  O: VSS approx 1000cc EBL on sheet and pad  Spec exam, lg amt clots evacuated,  Bimanual, exam limited by pt's pain  A: missed AB Actively bleeding  P: Dr Stefano Gaul notified of pt status and is en route, OR notified CBC and type and cross ordered IV LR  Prepare for emergent D&E  S.Leeann Must, CNM 10/20/12 at 10am

## 2012-10-20 NOTE — Progress Notes (Signed)
At the completion of the first unit, the second unit's row in Blood Administration Flowsheet was documented as complete at the same time. Therefore the second unit had to be reordered so that it could be charted against in EPIC in the Blood Administration Flowsheet. 

## 2012-10-20 NOTE — Progress Notes (Signed)
At the completion of the first unit, the second unit's row in Blood Administration Flowsheet was documented as complete at the same time. Therefore the second unit had to be reordered so that it could be charted against in EPIC in the Blood Administration Flowsheet.

## 2012-10-20 NOTE — H&P (Signed)
Admission History and Physical Exam for a Gynecology Patient   [name] is a 42 y.o. female, [id number], who presented for D and E because of a Missed Ab and heavy bleeding. She has been followed at the [facility] and Gynecology division of [facility]. A prior US showed a nonviable gestation. She tolerated her procedure well. She was orthostatic in the recovery room. A second IV line was started. A blood transfusion was begun. She will be observed overnight for symptomatic anemia. We will transfuse her 2 units of packed red blood cells. She may need a total of 4 units.  OB History    Grav  Para  Term  Preterm  Abortions  TAB  SAB  Ect  Mult  Living    7  4  4   2   2    4       Past Medical History   Diagnosis  Date   .  Medical history non-contributory     Prescriptions prior to admission   Medication  Sig  Dispense  Refill   .  acetaminophen (TYLENOL) 325 MG tablet  Take 325 mg by mouth every 6 (six) hours as needed for pain.      Past Surgical History   Procedure  Laterality  Date   .  Cesarean section     .  Wisdom teeth abstracted     .  Gall bladder removed      No Known Allergies  Family History: family history is not on file.  Social History: reports that she has never smoked. She does not have any smokeless tobacco history on file. She reports that she does not drink alcohol or use illicit drugs.  Review of systems: See HPI.  Admission Physical Exam:  There is no weight on file to calculate BMI.  Blood pressure 126/80, pulse 84, temperature 97.2 F (36.2 C), temperature source Oral, resp. rate 22, last menstrual period 08/03/2012, SpO2 100.00%.  HEENT: Within normal limits  Chest: Clear  Heart: Regular rate and rhythm  Breasts: No masses, skin changes, bleeding, or discharge present  Abdomen: Nontender, no masses  Extremities: Grossly normal  Neurologic exam: Grossly normal  Pelvic exam:  External genitalia: normal general  appearance  Vaginal: normal without tenderness, induration or masses  Cervix: slightly open  Adnexa: normal bimanual exam  Uterus: tender and enlarged   CBC    Component  Value  Date/Time    WBC  9.0  10/20/2012 1016    RBC  5.24*  10/20/2012 1016    HGB  12.1  10/20/2012 1016    HCT  35.6*  10/20/2012 1016    PLT  307  10/20/2012 1016    MCV  67.9*  10/20/2012 1016    MCH  23.1*  10/20/2012 1016    MCHC  34.0  10/20/2012 1016    RDW  16.2*  10/20/2012 1016    LYMPHSABS  2.3  09/22/2012 1121    MONOABS  0.6  09/22/2012 1121    EOSABS  0.0  09/22/2012 1121    BASOSABS  0.0  09/22/2012 1121    CBC    Component Value Date/Time   WBC 9.0 10/20/2012 1225   RBC 3.70* 10/20/2012 1225   HGB 8.3* 10/20/2012 1225   HCT 25.4* 10/20/2012 1225   PLT 227 10/20/2012 1225   MCV 68.6* 10/20/2012 1225   MCH 22.4* 10/20/2012 1225   MCHC 32.7 10/20/2012 1225  RDW 16.2* 10/20/2012 1225   LYMPHSABS 2.3 09/22/2012 1121   MONOABS 0.6 09/22/2012 1121   EOSABS 0.0 09/22/2012 1121   BASOSABS 0.0 09/22/2012 1121   Blood Type: A +  GC/Chl: Neg/Neg   Assessment:  Severe anemia Status post dilatation and evacuation for missed abortion Orthostasis  History of heavy bleeding    Plan:   We will observe the patient overnight. We will transfuse 2 units of packed red blood cells. If the patient is still orthostatic, then we will transfuse an additional 2 units. We will plan discharge tomorrow morning.  Janine Limbo  10/20/2012

## 2012-10-20 NOTE — Anesthesia Preprocedure Evaluation (Signed)
Anesthesia Evaluation  Patient identified by MRN, date of birth, ID band Patient awake    Reviewed: Allergy & Precautions, H&P , NPO status , Patient's Chart, lab work & pertinent test results  Airway Mallampati: III TM Distance: >3 FB Neck ROM: full    Dental no notable dental hx. (+) Teeth Intact   Pulmonary neg pulmonary ROS,  breath sounds clear to auscultation  Pulmonary exam normal       Cardiovascular negative cardio ROS  Rhythm:regular Rate:Normal     Neuro/Psych negative neurological ROS  negative psych ROS   GI/Hepatic negative GI ROS, Neg liver ROS,   Endo/Other  negative endocrine ROS  Renal/GU negative Renal ROS  negative genitourinary   Musculoskeletal   Abdominal Normal abdominal exam  (+)   Peds  Hematology negative hematology ROS (+)   Anesthesia Other Findings   Reproductive/Obstetrics (+) Pregnancy Missed Ab                           Anesthesia Physical Anesthesia Plan  ASA: II and emergent  Anesthesia Plan: General LMA   Post-op Pain Management:    Induction:   Airway Management Planned:   Additional Equipment:   Intra-op Plan:   Post-operative Plan:   Informed Consent: I have reviewed the patients History and Physical, chart, labs and discussed the procedure including the risks, benefits and alternatives for the proposed anesthesia with the patient or authorized representative who has indicated his/her understanding and acceptance.   Dental Advisory Given  Plan Discussed with: Anesthesiologist, CRNA and Surgeon  Anesthesia Plan Comments:         Anesthesia Quick Evaluation

## 2012-10-20 NOTE — Anesthesia Postprocedure Evaluation (Signed)
  Anesthesia Post-op Note  Patient: Carolyn Bryan  Procedure(s) Performed: Procedure(s): DILATATION AND EVACUATION (N/A)  Patient Location: PACU  Anesthesia Type:General  Level of Consciousness: awake, alert  and oriented  Airway and Oxygen Therapy: Patient Spontanous Breathing  Post-op Pain: none  Post-op Assessment: Post-op Vital signs reviewed, Patient's Cardiovascular Status Stable, Respiratory Function Stable, Patent Airway, No signs of Nausea or vomiting and Pain level controlled. Patient had syncopal episode in PACU associated with hypotension. Responded to IVF and ephedrine. Patient transfused blood per Dr. Stefano Gaul. He will admit for overnight observation.  Post-op Vital Signs: Reviewed and stable  Complications: No apparent anesthesia complications

## 2012-10-20 NOTE — Anesthesia Procedure Notes (Addendum)
Procedure Name: LMA Insertion Date/Time: 10/20/2012 11:04 AM Performed by: Graciela Husbands Pre-anesthesia Checklist: Emergency Drugs available, Suction available, Patient identified, Timeout performed and Patient being monitored Patient Re-evaluated:Patient Re-evaluated prior to inductionOxygen Delivery Method: Circle system utilized Preoxygenation: Pre-oxygenation with 100% oxygen Intubation Type: IV induction LMA: LMA inserted LMA Size: 4.0 Number of attempts: 1 Placement Confirmation: breath sounds checked- equal and bilateral and positive ETCO2 Tube secured with: Tape Dental Injury: Teeth and Oropharynx as per pre-operative assessment

## 2012-10-20 NOTE — Progress Notes (Signed)
Comfortable in her room.  BP 109/74  Pulse 106  Temp(Src) 98.3 F (36.8 C) (Oral)  Resp 22  SpO2 99%  LMP 08/03/2012  Will check CBC after transfusion.  Dr. Stefano Gaul

## 2012-10-21 ENCOUNTER — Encounter (HOSPITAL_COMMUNITY): Payer: Self-pay | Admitting: Obstetrics and Gynecology

## 2012-10-21 LAB — TYPE AND SCREEN
ABO/RH(D): A POS
Antibody Screen: NEGATIVE
Unit division: 0
Unit division: 0
Unit division: 0
Unit division: 0

## 2012-10-21 LAB — CBC
HCT: 28.4 % — ABNORMAL LOW (ref 36.0–46.0)
Hemoglobin: 9.6 g/dL — ABNORMAL LOW (ref 12.0–15.0)
MCH: 23.6 pg — ABNORMAL LOW (ref 26.0–34.0)
MCHC: 33.8 g/dL (ref 30.0–36.0)

## 2012-10-21 MED ORDER — EPHEDRINE 5 MG/ML INJ
10.0000 mg | Freq: Once | INTRAVENOUS | Status: AC
  Administered 2012-10-21: 10 mg via INTRAVENOUS

## 2012-10-21 MED ORDER — HYDROCODONE-ACETAMINOPHEN 5-325 MG PO TABS: 1.0000 | ORAL_TABLET | ORAL | Status: DC | PRN

## 2012-10-21 NOTE — Discharge Summary (Signed)
Physician Discharge Summary  Patient ID: Carolyn Bryan MRN: [id number] DOB/AGE: 1970/10/05 42 y.o.  Admit date: 10/20/2012 Discharge date: 10/21/2012  Admission Diagnoses: incomplete abortion Anemia hemorrhage  Discharge Diagnoses:  Active Problems:   * No active hospital problems. * anemia  Discharged Condition: stable  Hospital Course: pt presented with heavy bleeding from incomplete abortion.  She hemorrhaged and received two units packed red blood cells and was taken to the OR for a D&E  Consults: None  Significant Diagnostic Studies: labs  Treatments: IV hydration and surgery: D&E, blood  Discharge Exam: Blood pressure 116/58, pulse 78, temperature 97.8 F (36.6 C), temperature source Oral, resp. rate 16, height 5\' 2"  (1.575 m), weight 182 lb (82.555 kg), last menstrual period 08/03/2012, SpO2 100.00%. General appearance: alert and cooperative Resp: clear to auscultation bilaterally Cardio: regular rate and rhythm GI: soft, non-tender; bowel sounds normal; no masses,  no organomegaly Pelvic: mild VB Extremities: extremities normal, atraumatic, no cyanosis or edema  Disposition:   Discharge Orders   Future Orders Complete By Expires     Call MD for:  persistant dizziness or light-headedness  As directed     Call MD for:  severe uncontrolled pain  As directed     Call MD for:  temperature >100.4  As directed     Diet general  As directed     Discharge instructions  As directed     Comments:      Call for vaginal bleeding if you soak greater than pad per hour    Increase activity slowly  As directed     May shower / Bathe  As directed     Sexual Activity Restrictions  As directed     Comments:      Pelvic rest.  Nothing in the vagina        Medication List    TAKE these medications       acetaminophen 325 MG tablet  Commonly known as:  TYLENOL  Take 325 mg by mouth every 6 (six) hours as needed for pain.     ferrous sulfate 325 (65 FE) MG tablet   Commonly known as:  FERROUSUL  Take 1 tablet (325 mg total) by mouth 2 (two) times daily with a meal.     HYDROcodone-acetaminophen 5-325 MG per tablet  Commonly known as:  NORCO/VICODIN  Take 1-2 tablets by mouth every 4 (four) hours as needed.     ibuprofen 800 MG tablet  Commonly known as:  ADVIL,MOTRIN  Take 1 tablet (800 mg total) by mouth every 8 (eight) hours as needed for pain.     oxyCODONE-acetaminophen 5-325 MG per tablet  Commonly known as:  ROXICET  Take 1 tablet by mouth every 4 (four) hours as needed for pain.           Follow-up Information   Follow up with [facility] In 2 weeks.   Contact information:   [address], [address] [place] [address]     do not take the tylenol, oxycodone and Roxicet at the same time.  They all contain tylenol.    SignedJaymes Graff A 10/21/2012, 1:23 PM

## 2012-10-21 NOTE — Progress Notes (Signed)
Discharge teaching complete.  Pt verbalizes understanding of discharge information.  Pt ambulated to car. Discharged into care of children. Denies any pain, discomfort or heavy vagina bleeding.

## 2012-10-21 NOTE — Progress Notes (Addendum)
Lab notified to returned 2 units PRBC as ordered per Dr. Pennie Rushing.  Not needed at this time.

## 2012-11-02 ENCOUNTER — Ambulatory Visit (HOSPITAL_COMMUNITY)
Admission: RE | Admit: 2012-11-02 | Payer: Medicaid Other | Source: Ambulatory Visit | Admitting: Obstetrics and Gynecology

## 2012-11-02 ENCOUNTER — Encounter (HOSPITAL_COMMUNITY): Admission: RE | Payer: Self-pay | Source: Ambulatory Visit

## 2012-11-02 SURGERY — DILATION AND EVACUATION, UTERUS
Anesthesia: Choice

## 2012-11-09 ENCOUNTER — Other Ambulatory Visit: Payer: Self-pay | Admitting: Obstetrics and Gynecology

## 2013-07-13 ENCOUNTER — Other Ambulatory Visit (HOSPITAL_COMMUNITY): Payer: Self-pay | Admitting: Family Medicine

## 2013-07-13 DIAGNOSIS — Z1231 Encounter for screening mammogram for malignant neoplasm of breast: Secondary | ICD-10-CM

## 2013-08-04 ENCOUNTER — Encounter (HOSPITAL_COMMUNITY): Payer: Self-pay

## 2013-08-04 ENCOUNTER — Ambulatory Visit (HOSPITAL_COMMUNITY)
Admission: RE | Admit: 2013-08-04 | Discharge: 2013-08-04 | Disposition: A | Discharge status: Home / Self Care | Payer: BC Managed Care – PPO | Source: Ambulatory Visit | Attending: Family Medicine | Admitting: Family Medicine

## 2013-08-04 DIAGNOSIS — Z1231 Encounter for screening mammogram for malignant neoplasm of breast: Secondary | ICD-10-CM | POA: Insufficient documentation

## 2014-01-18 ENCOUNTER — Other Ambulatory Visit: Payer: Self-pay | Admitting: Obstetrics and Gynecology

## 2014-05-07 ENCOUNTER — Encounter (HOSPITAL_COMMUNITY): Payer: Self-pay

## 2014-06-28 ENCOUNTER — Emergency Department (HOSPITAL_COMMUNITY)
Admission: EM | Admit: 2014-06-28 | Discharge: 2014-06-28 | Discharge status: Absent without leave | Payer: BC Managed Care – PPO

## 2014-06-28 ENCOUNTER — Emergency Department (HOSPITAL_COMMUNITY)
Admission: EM | Admit: 2014-06-28 | Discharge: 2014-06-29 | Disposition: A | Discharge status: Home / Self Care | Payer: BC Managed Care – PPO | Attending: Emergency Medicine | Admitting: Emergency Medicine

## 2014-06-28 DIAGNOSIS — Z791 Long term (current) use of non-steroidal anti-inflammatories (NSAID): Secondary | ICD-10-CM | POA: Insufficient documentation

## 2014-06-28 DIAGNOSIS — R112 Nausea with vomiting, unspecified: Secondary | ICD-10-CM | POA: Diagnosis not present

## 2014-06-28 DIAGNOSIS — Z9049 Acquired absence of other specified parts of digestive tract: Secondary | ICD-10-CM | POA: Diagnosis not present

## 2014-06-28 DIAGNOSIS — E876 Hypokalemia: Secondary | ICD-10-CM | POA: Diagnosis not present

## 2014-06-28 DIAGNOSIS — Z79899 Other long term (current) drug therapy: Secondary | ICD-10-CM | POA: Insufficient documentation

## 2014-06-28 DIAGNOSIS — R1013 Epigastric pain: Secondary | ICD-10-CM

## 2014-06-28 DIAGNOSIS — Z3202 Encounter for pregnancy test, result negative: Secondary | ICD-10-CM | POA: Diagnosis not present

## 2014-06-28 MED ORDER — ONDANSETRON 4 MG PO TBDP
4.0000 mg | ORAL_TABLET | Freq: Once | ORAL | Status: AC
  Administered 2014-06-29: 4 mg via ORAL
  Filled 2014-06-28: qty 1

## 2014-06-28 MED ORDER — GI COCKTAIL ~~LOC~~
30.0000 mL | Freq: Once | ORAL | Status: AC
  Administered 2014-06-29: 30 mL via ORAL
  Filled 2014-06-28: qty 30

## 2014-06-28 NOTE — ED Notes (Signed)
Pt c/o abdominal pain starting around 2230. No n/v/d. Pt states she was sitting when sudden pain started. Pt reports some ETOH tonight.

## 2014-06-28 NOTE — ED Provider Notes (Signed)
CSN: [id number]     Arrival date & time 06/28/14  2302 History  This chart was scribe for No att. providers found by Angelene GiovanniEmmanuella Mensah, ED Scribe. The patient was seen in room A03C/A03C and the patient's care was started at 11:49 PM.    Chief Complaint  Patient presents with  . Abdominal Pain   Patient is a 43 y.o. female presenting with abdominal pain. The history is provided by the patient. No language interpreter was used.  Abdominal Pain Pain location:  Epigastric Pain radiates to:  Does not radiate Onset quality:  Sudden Duration:  1 hour Timing:  Constant Progression:  Worsening Chronicity:  New Context: eating   Relieved by:  None tried Worsened by:  Nothing tried Ineffective treatments:  None tried Associated symptoms: nausea and vomiting   Associated symptoms: no chest pain, no chills, no constipation, no cough, no diarrhea, no fever and no shortness of breath    HPI Comments: Eliseo GumHuyen C Hinnenkamp is a 43 y.o. female with a pat hx of gall bladder removal who presents to the Emergency Department complaining of a 4-5/10 constant gradually worsening epigastric pain onset 30 minutes ago after dinner. She denies having any thing spicy to eat for dinner tonight. She reports an associated one episode of vomiting. She denies any back pain. She also denies having these symptoms in the past. No fever or chills. No chest pain. No shortness of breath. No gross blood in stool or melena.  Past Medical History  Diagnosis Date  . Medical history non-contributory    Past Surgical History  Procedure Laterality Date  . Cesarean section    . Wisdom teeth abstracted    . Gall bladder removed    . Dilation and evacuation N/A 10/20/2012    Procedure: DILATATION AND EVACUATION;  Surgeon: Kirkland HunArthur Stringer, MD;  Location: [facility];  Service: Gynecology;  Laterality: N/A;   No family history on file. History  Substance Use Topics  . Smoking status: Never Smoker   . Smokeless tobacco: Not on file  .  Alcohol Use: No   OB History    Gravida Para Term Preterm AB TAB SAB Ectopic Multiple Living   7 4 4  2  2   4      Review of Systems  Constitutional: Negative for fever and chills.  Respiratory: Negative for cough and shortness of breath.   Cardiovascular: Negative for chest pain.  Gastrointestinal: Positive for nausea, vomiting and abdominal pain. Negative for diarrhea, constipation and blood in stool.  Musculoskeletal: Negative for back pain.  Skin: Negative for rash and wound.  Neurological: Negative for dizziness, weakness, light-headedness, numbness and headaches.  All other systems reviewed and are negative.     Allergies  Review of patient's allergies indicates no known allergies.  Home Medications   Prior to Admission medications   Medication Sig Start Date End Date Taking? Authorizing Provider  ferrous sulfate (FERROUSUL) 325 (65 FE) MG tablet Take 1 tablet (325 mg total) by mouth 2 (two) times daily with a meal. 10/20/12   Kirkland HunArthur Stringer, MD  HYDROcodone-acetaminophen (NORCO/VICODIN) 5-325 MG per tablet Take 1-2 tablets by mouth every 4 (four) hours as needed. 10/21/12   Jaymes GraffNaima Dillard, MD  ibuprofen (ADVIL,MOTRIN) 800 MG tablet Take 1 tablet (800 mg total) by mouth every 8 (eight) hours as needed for pain. 10/20/12   Kirkland HunArthur Stringer, MD  lansoprazole (PREVACID) 30 MG capsule Take 1 capsule (30 mg total) by mouth daily at 12 noon. 06/29/14   Onalee Huaavid  Ranae PalmsYelverton, MD  ondansetron (ZOFRAN ODT) 4 MG disintegrating tablet 4mg  ODT q4 hours prn nausea/vomit 06/29/14   Loren Raceravid Semaj Kham, MD   BP 107/57 mmHg  Pulse 75  Temp(Src) 98.2 F (36.8 C) (Oral)  Resp 20  Ht 5\' 2"  (1.575 m)  Wt 158 lb (71.668 kg)  BMI 28.89 kg/m2  SpO2 95% Physical Exam  Constitutional: She is oriented to person, place, and time. She appears well-developed and well-nourished. No distress.  HENT:  Head: Normocephalic and atraumatic.  Mouth/Throat: Oropharynx is clear and moist.  Eyes: EOM are normal.  Pupils are equal, round, and reactive to light.  Neck: Normal range of motion. Neck supple.  Cardiovascular: Normal rate and regular rhythm.   Pulmonary/Chest: Effort normal and breath sounds normal. No respiratory distress. She has no wheezes. She has no rales. She exhibits no tenderness.  Abdominal: Soft. Bowel sounds are normal. She exhibits no distension and no mass. There is tenderness (patient with mild epigastric tenderness with palpation). There is no rebound and no guarding.  Musculoskeletal: Normal range of motion. She exhibits no edema or tenderness.  No CVA tenderness bilaterally.  Neurological: She is alert and oriented to person, place, and time.  Moves all extremities without deficit. Sensation is grossly intact.  Skin: Skin is warm and dry. No rash noted. No erythema.  Psychiatric: She has a normal mood and affect. Her behavior is normal.  Nursing note and vitals reviewed.   ED Course  Procedures (including critical care time) DIAGNOSTIC STUDIES: Oxygen Saturation is 100% on RA, normal by my interpretation.    COORDINATION OF CARE: 11:52 PM- Pt advised of plan for treatment and pt agrees.    Labs Review Labs Reviewed  CBC WITH DIFFERENTIAL - Abnormal; Notable for the following:    RBC 5.25 (*)    Hemoglobin 11.8 (*)    MCV 69.1 (*)    MCH 22.5 (*)    All other components within normal limits  COMPREHENSIVE METABOLIC PANEL - Abnormal; Notable for the following:    Potassium 2.7 (*)    CO2 17 (*)    Glucose, Bld 107 (*)    AST 88 (*)    ALT 50 (*)    All other components within normal limits  URINALYSIS, ROUTINE W REFLEX MICROSCOPIC - Abnormal; Notable for the following:    APPearance CLOUDY (*)    Hgb urine dipstick SMALL (*)    Urobilinogen, UA 2.0 (*)    All other components within normal limits  LIPASE, BLOOD  PREGNANCY, URINE  URINE MICROSCOPIC-ADD ON  I-STAT TROPOININ, ED    Imaging Review No results found.   EKG Interpretation None       Date: 06/29/2014  Rate: 90  Rhythm: normal sinus rhythm  QRS Axis: normal  Intervals: normal  ST/T Wave abnormalities: normal  Conduction Disutrbances:none  Narrative Interpretation:   Old EKG Reviewed: none available   MDM   Final diagnoses:  Epigastric abdominal pain  Hypokalemia      I personally performed the services described in this documentation, which was scribed in my presence. The recorded information has been reviewed and is accurate.    Patient's epigastric pain is improved after medication. Potassium is replaced. Been given return precautions and is voiced understanding.  Loren Raceravid Bobbijo Holst, MD 06/30/14 0430

## 2014-06-29 LAB — CBC WITH DIFFERENTIAL/PLATELET
BASOS PCT: 1 % (ref 0–1)
Basophils Absolute: 0.1 10*3/uL (ref 0.0–0.1)
EOS PCT: 2 % (ref 0–5)
Eosinophils Absolute: 0.1 10*3/uL (ref 0.0–0.7)
HEMATOCRIT: 36.3 % (ref 36.0–46.0)
HEMOGLOBIN: 11.8 g/dL — AB (ref 12.0–15.0)
LYMPHS ABS: 2.2 10*3/uL (ref 0.7–4.0)
Lymphocytes Relative: 38 % (ref 12–46)
MCH: 22.5 pg — AB (ref 26.0–34.0)
MCHC: 32.5 g/dL (ref 30.0–36.0)
MCV: 69.1 fL — AB (ref 78.0–100.0)
MONO ABS: 0.6 10*3/uL (ref 0.1–1.0)
MONOS PCT: 10 % (ref 3–12)
NEUTROS ABS: 2.8 10*3/uL (ref 1.7–7.7)
Neutrophils Relative %: 49 % (ref 43–77)
Platelets: 264 10*3/uL (ref 150–400)
RBC: 5.25 MIL/uL — AB (ref 3.87–5.11)
RDW: 15.5 % (ref 11.5–15.5)
WBC: 5.8 10*3/uL (ref 4.0–10.5)

## 2014-06-29 LAB — URINE MICROSCOPIC-ADD ON

## 2014-06-29 LAB — URINALYSIS, ROUTINE W REFLEX MICROSCOPIC
Bilirubin Urine: NEGATIVE
GLUCOSE, UA: NEGATIVE mg/dL
KETONES UR: NEGATIVE mg/dL
Leukocytes, UA: NEGATIVE
Nitrite: NEGATIVE
PROTEIN: NEGATIVE mg/dL
Specific Gravity, Urine: 1.024 (ref 1.005–1.030)
UROBILINOGEN UA: 2 mg/dL — AB (ref 0.0–1.0)
pH: 5 (ref 5.0–8.0)

## 2014-06-29 LAB — COMPREHENSIVE METABOLIC PANEL
ALT: 50 U/L — AB (ref 0–35)
ANION GAP: 14 (ref 5–15)
AST: 88 U/L — ABNORMAL HIGH (ref 0–37)
Albumin: 3.7 g/dL (ref 3.5–5.2)
Alkaline Phosphatase: 45 U/L (ref 39–117)
BUN: 11 mg/dL (ref 6–23)
CALCIUM: 8.9 mg/dL (ref 8.4–10.5)
CO2: 17 mmol/L — AB (ref 19–32)
Chloride: 108 mEq/L (ref 96–112)
Creatinine, Ser: 0.6 mg/dL (ref 0.50–1.10)
GLUCOSE: 107 mg/dL — AB (ref 70–99)
Potassium: 2.7 mmol/L — CL (ref 3.5–5.1)
SODIUM: 139 mmol/L (ref 135–145)
TOTAL PROTEIN: 6.7 g/dL (ref 6.0–8.3)
Total Bilirubin: 0.9 mg/dL (ref 0.3–1.2)

## 2014-06-29 LAB — PREGNANCY, URINE: Preg Test, Ur: NEGATIVE

## 2014-06-29 LAB — LIPASE, BLOOD: Lipase: 28 U/L (ref 11–59)

## 2014-06-29 LAB — I-STAT TROPONIN, ED: TROPONIN I, POC: 0 ng/mL (ref 0.00–0.08)

## 2014-06-29 MED ORDER — SODIUM CHLORIDE 0.9 % IV BOLUS (SEPSIS)
1000.0000 mL | Freq: Once | INTRAVENOUS | Status: AC
  Administered 2014-06-29: 1000 mL via INTRAVENOUS

## 2014-06-29 MED ORDER — POTASSIUM CHLORIDE 10 MEQ/100ML IV SOLN
10.0000 meq | INTRAVENOUS | Status: AC
  Administered 2014-06-29 (×3): 10 meq via INTRAVENOUS
  Filled 2014-06-29 (×3): qty 100

## 2014-06-29 MED ORDER — POTASSIUM CHLORIDE CRYS ER 20 MEQ PO TBCR
40.0000 meq | EXTENDED_RELEASE_TABLET | Freq: Once | ORAL | Status: AC
Start: 2014-06-29 — End: 2014-06-29
  Administered 2014-06-29: 40 meq via ORAL
  Filled 2014-06-29: qty 2

## 2014-06-29 MED ORDER — LANSOPRAZOLE 30 MG PO CPDR: 30.0000 mg | DELAYED_RELEASE_CAPSULE | Freq: Every day | ORAL | Status: DC

## 2014-06-29 MED ORDER — ONDANSETRON 4 MG PO TBDP: ORAL_TABLET | ORAL | Status: DC

## 2014-06-29 NOTE — Discharge Instructions (Signed)

## 2014-06-29 NOTE — ED Notes (Signed)
Critical lab given to Dr. Yelverton 

## 2014-07-09 ENCOUNTER — Other Ambulatory Visit (HOSPITAL_COMMUNITY): Payer: Self-pay | Admitting: Family Medicine

## 2014-07-09 DIAGNOSIS — Z1231 Encounter for screening mammogram for malignant neoplasm of breast: Secondary | ICD-10-CM

## 2014-08-06 ENCOUNTER — Ambulatory Visit (HOSPITAL_COMMUNITY)
Admission: RE | Admit: 2014-08-06 | Discharge: 2014-08-06 | Disposition: A | Discharge status: Home / Self Care | Payer: BLUE CROSS/BLUE SHIELD | Source: Ambulatory Visit | Attending: Family Medicine | Admitting: Family Medicine

## 2014-08-06 DIAGNOSIS — Z1231 Encounter for screening mammogram for malignant neoplasm of breast: Secondary | ICD-10-CM | POA: Insufficient documentation

## 2015-08-20 ENCOUNTER — Emergency Department (HOSPITAL_COMMUNITY): Payer: BLUE CROSS/BLUE SHIELD

## 2015-08-20 ENCOUNTER — Emergency Department (HOSPITAL_COMMUNITY)
Admission: EM | Admit: 2015-08-20 | Discharge: 2015-08-20 | Disposition: A | Discharge status: Home / Self Care | Payer: BLUE CROSS/BLUE SHIELD | Attending: Emergency Medicine | Admitting: Emergency Medicine

## 2015-08-20 ENCOUNTER — Encounter (HOSPITAL_COMMUNITY): Payer: Self-pay | Admitting: Emergency Medicine

## 2015-08-20 DIAGNOSIS — Z113 Encounter for screening for infections with a predominantly sexual mode of transmission: Secondary | ICD-10-CM | POA: Insufficient documentation

## 2015-08-20 DIAGNOSIS — Z9049 Acquired absence of other specified parts of digestive tract: Secondary | ICD-10-CM | POA: Insufficient documentation

## 2015-08-20 DIAGNOSIS — K859 Acute pancreatitis without necrosis or infection, unspecified: Secondary | ICD-10-CM | POA: Insufficient documentation

## 2015-08-20 DIAGNOSIS — Z711 Person with feared health complaint in whom no diagnosis is made: Secondary | ICD-10-CM

## 2015-08-20 DIAGNOSIS — R109 Unspecified abdominal pain: Secondary | ICD-10-CM | POA: Diagnosis present

## 2015-08-20 DIAGNOSIS — N898 Other specified noninflammatory disorders of vagina: Secondary | ICD-10-CM | POA: Insufficient documentation

## 2015-08-20 DIAGNOSIS — Z79899 Other long term (current) drug therapy: Secondary | ICD-10-CM | POA: Insufficient documentation

## 2015-08-20 LAB — URINALYSIS, ROUTINE W REFLEX MICROSCOPIC
Bilirubin Urine: NEGATIVE
Glucose, UA: NEGATIVE mg/dL
Ketones, ur: NEGATIVE mg/dL
Leukocytes, UA: NEGATIVE
NITRITE: NEGATIVE
Protein, ur: NEGATIVE mg/dL
Specific Gravity, Urine: 1.009 (ref 1.005–1.030)
pH: 6.5 (ref 5.0–8.0)

## 2015-08-20 LAB — BASIC METABOLIC PANEL
Anion gap: 9 (ref 5–15)
BUN: 10 mg/dL (ref 6–20)
CALCIUM: 9.2 mg/dL (ref 8.9–10.3)
CO2: 22 mmol/L (ref 22–32)
Chloride: 106 mmol/L (ref 101–111)
Creatinine, Ser: 0.57 mg/dL (ref 0.44–1.00)
GFR calc non Af Amer: >60 mL/min (ref >60)
Glucose, Bld: 96 mg/dL (ref 65–99)
POTASSIUM: 4.2 mmol/L (ref 3.5–5.1)
SODIUM: 137 mmol/L (ref 135–145)

## 2015-08-20 LAB — URINE MICROSCOPIC-ADD ON

## 2015-08-20 LAB — I-STAT TROPONIN, ED: Troponin i, poc: 0 ng/mL (ref 0.00–0.08)

## 2015-08-20 LAB — CBC
HCT: 37.1 % (ref 36.0–46.0)
HEMOGLOBIN: 12.2 g/dL (ref 12.0–15.0)
MCH: 22.5 pg — AB (ref 26.0–34.0)
MCHC: 32.9 g/dL (ref 30.0–36.0)
MCV: 68.5 fL — ABNORMAL LOW (ref 78.0–100.0)
PLATELETS: 258 10*3/uL (ref 150–400)
RBC: 5.42 MIL/uL — ABNORMAL HIGH (ref 3.87–5.11)
RDW: 15.4 % (ref 11.5–15.5)
WBC: 10.6 10*3/uL — ABNORMAL HIGH (ref 4.0–10.5)

## 2015-08-20 LAB — HEPATIC FUNCTION PANEL
ALBUMIN: 3.2 g/dL — AB (ref 3.5–5.0)
ALT: 40 U/L (ref 14–54)
AST: 77 U/L — ABNORMAL HIGH (ref 15–41)
Alkaline Phosphatase: 40 U/L (ref 38–126)
Bilirubin, Direct: 0.2 mg/dL (ref 0.1–0.5)
Indirect Bilirubin: 0.5 mg/dL (ref 0.3–0.9)
TOTAL PROTEIN: 6.5 g/dL (ref 6.5–8.1)
Total Bilirubin: 0.7 mg/dL (ref 0.3–1.2)

## 2015-08-20 LAB — WET PREP, GENITAL
CLUE CELLS WET PREP: NONE SEEN
SPERM: NONE SEEN
TRICH WET PREP: NONE SEEN
Yeast Wet Prep HPF POC: NONE SEEN

## 2015-08-20 LAB — D-DIMER, QUANTITATIVE (NOT AT ARMC): D-Dimer, Quant: 0.27 ug/mL-FEU (ref 0.00–0.50)

## 2015-08-20 LAB — LIPASE, BLOOD: Lipase: 100 U/L — ABNORMAL HIGH (ref 11–51)

## 2015-08-20 LAB — POC URINE PREG, ED: Preg Test, Ur: NEGATIVE

## 2015-08-20 MED ORDER — ONDANSETRON 4 MG PO TBDP
8.0000 mg | ORAL_TABLET | Freq: Once | ORAL | Status: AC
Start: 2015-08-20 — End: 2015-08-20
  Administered 2015-08-20: 8 mg via ORAL
  Filled 2015-08-20: qty 2

## 2015-08-20 MED ORDER — AZITHROMYCIN 250 MG PO TABS
1000.0000 mg | ORAL_TABLET | Freq: Once | ORAL | Status: AC
  Administered 2015-08-20: 1000 mg via ORAL
  Filled 2015-08-20: qty 4

## 2015-08-20 MED ORDER — ONDANSETRON HCL 4 MG/2ML IJ SOLN: 4.0000 mg | Freq: Once | INTRAMUSCULAR | Status: DC

## 2015-08-20 MED ORDER — HYDROCODONE-ACETAMINOPHEN 5-325 MG PO TABS: 1.0000 | ORAL_TABLET | Freq: Four times a day (QID) | ORAL | Status: DC | PRN

## 2015-08-20 MED ORDER — LIDOCAINE HCL (PF) 1 % IJ SOLN
5.0000 mL | Freq: Once | INTRAMUSCULAR | Status: AC
  Administered 2015-08-20: 2 mL via INTRADERMAL
  Filled 2015-08-20: qty 5

## 2015-08-20 MED ORDER — ONDANSETRON 8 MG PO TBDP: ORAL_TABLET | ORAL | Status: AC

## 2015-08-20 MED ORDER — CEFTRIAXONE SODIUM 250 MG IJ SOLR
250.0000 mg | Freq: Once | INTRAMUSCULAR | Status: AC
  Administered 2015-08-20: 250 mg via INTRAMUSCULAR
  Filled 2015-08-20: qty 250

## 2015-08-20 NOTE — ED Notes (Signed)
Patient states flank pain with urinary frequency, dark urine, and white discharge when she wipes.   Patient states started this morning at 0600.  Patient states waves of back pain every 15 minutes.

## 2015-08-20 NOTE — ED Notes (Signed)
Pt received from triage at 1250. Complaining of mid-back pain that began at 0600 this morning, occasionally shoots underneath ribs. Reports pain starting 2 weeks ago, but worse today. Rates pain as 6/10 Feels nauseous, but no vomitting. Denies urine odor.

## 2015-08-20 NOTE — Discharge Instructions (Signed)
1. Medications: zofran, vicodin, usual home medications 2. Treatment: rest, drink plenty of fluids, advance diet slowly 3. Follow Up: Please followup with your primary doctor in 2 days for discussion of your diagnoses and further evaluation after today's visit; if you do not have a primary care doctor use the resource guide provided to find one; Please return to the ER for persistent vomiting, high fevers or worsening symptoms    Vim T?y C?p Tnh (Acute Pancreatitis) Vim t?y c?p tnh l b?nh m trong ? tuy?n t?y ??t nhin b? vim. Tuy?n t?y l m?t tuy?n l?n n?m pha sau d? dy. Tuy?n t?y s?n sinh ra cc enzyme gip tiu ha th?c ?n. Tuy?n t?y c?ng gi?i phng ra hc mn glucagon v insulin c th? gip ?i?u ch?nh l??ng ???ng trong mu. T?n th??ng tuy?n t?y x?y ra khi cc enzym tiu ha t? tuy?n t?y ???c kch ho?t v b?t ??u t?n cng tuy?n t?y tr??c khi ???c gi?i phng vo ru?t. H?u h?t cc c?n c?p tnh ko di m?t vi ngy v c th? gy ra bi?n ch?ng nghim tr?ng. M?t s? ng??i b? m?t n??c v t?t huy?t p. Trong tr??ng h?p n?ng, ch?y mu trong tuy?n t?y c th? d?n ??n s?c v c th? ?e d?a tnh m?ng. Ph?i, tim v th?n c th? b? suy.  NGUYN NHN Vim t?y c th? x?y ra v?i b?t c? ai. Trong m?t s? tr??ng h?p, khng xc ??nh ???c nguyn nhn. H?u h?t cc tr??ng h?p l do:  L?m d?ng r??u.  S?i m?t. Nguyn nhn hi?m g?p khc l:  M?t s? lo?i thu?c nh?t ??nh.  Ti?p xc v?i m?t s? ha ch?t nh?t ??nh.  Nhi?m trng.  T?n th??ng do tai n?n (ch?n th??ng).  Ph?u thu?t b?ng. TRI?U CH?NG  ?au ? vng b?ng trn c th? lan ra sau l?ng.  Nh?y c?m ?au v s?ng ? b?ng.  Bu?n nn v nn m?a. CH?N ?ON Chuyn gia ch?m [place] s?c kh?e s? khm th?c th?. Xt nghi?m mu v phn c th? ???c th?c hi?n ?? xc nh?n ch?n ?on. Ki?m tra hnh ?nh c?ng c th? ???c th?c hi?n, ch?ng h?n nh? ch?p X-quang, ch?p CT ho?c siu m b?ng. ?I?U TR? ?i?u tr? th??ng c?n ph?i n?m vi?n. ?i?u tr? c th? bao g?m:  Thu?c gi?m  ?au.  Thay th? d?ch thng qua m?t ???ng truy?n t?nh m?ch (IV).  ??t m?t ?ng trong d? dy ?? lo?i b? cc ch?t trong d? dy v ki?m sot nn.  Khng ?n trong 3 ho?c 4 ngy. Cch ny gip tuy?n t?y ???c ngh? ng?i, v khng s?n sinh enzym c th? gy t?n th??ng thm.  Thu?c khng sinh n?u tnh tr?ng c?a b?n l do nhi?m trng.  Ph?u thu?t tuy?n t?y ho?c ti m?t. H??NG D?N CH?M [place] T?I NH  Tun th? ch? ?? ?n u?ng ???c chuyn gia ch?m [place] s?c kh?e t? v?n. ?i?u ny c th? lin quan ??n vi?c trnh u?ng r??u v gi?m l??ng ch?t bo trong ch? ?? ?n u?ng c?a b?n.  ?n cc b?a ?n nh? h?n, th??ng xuyn h?n. ?i?u ny lm gi?m l??ng d?ch tiu ha do tuy?n t?y s?n sinh.  U?ng ?? n??c ?? gi? cho n??c ti?u trong ho?c vng nh?t.  Ch? s? d?ng thu?c khng c?n k ??n ho?c thu?c c?n k toa theo ch? d?n c?a chuyn gia ch?m [place] s?c kh?e.  Trnh u?ng r??u n?u r??u gy ra tnh tr?ng c?a b?n.  Khng ht thu?c l.  Ngh? ng?i th?t nhi?u.  Ki?m tra l??ng ???ng trong mu c?a b?n ? nh theo ch? d?n c?a chuyn gia ch?m [place] s?c kh?e.  Lun th?c hi?n m?i cu?c h?n khm l?i theo ch? d?n c?a chuyn gia ch?m [place] s?c kh?e. HY ?I KHM N?U:  B?n khng ph?c h?i nhanh nh? mong ??i.  B?n b? cc tri?u ch?ng m?i ho?c tri?u ch?ng tr?m tr?ng h?n.  B?n b? ?au dai d?ng, y?u ho?c bu?n nn.  B?n h?i ph?c v sau ? l?i b? m?t c?n ?au khc. HY NGAY L?P T?C ?I KHM N?U:  B?n khng th? ?n ho?c lun b? m?t n??c.  C?n ?au c?a b?n n?ng h?n.  B?n b? s?t ho?c c cc tri?u ch?ng ko di h?n 2-3 ngy.  B?n b? s?t v cc tri?u ch?ng c?a b?n ??t nhin x?u ?i.  Da ho?c lng tr?ng m?t c?a b?n chuy?n sang mu vng (b?nh vng da).  B?n b? nn m?a.  B?n c?m th?y chng m?t ho?c ng?t x?u.  L??ng ???ng trong mu c?a b?n cao (trn 300 mg/dL). ??M B?O B?N:  Hi?u cc h??ng d?n ny.  S? theo di tnh tr?ng c?a mnh.  S? yu c?u tr? gip ngay l?p t?c n?u b?n c?m th?y khng ?? ho?c tnh tr?ng tr?m tr?ng h?n.   Thng tin ny khng  nh?m m?c ?ch thay th? cho l?i khuyn m chuyn gia ch?m [place] s?c kh?e ni v?i qu v?. Hy b?o ??m qu v? ph?i th?o lu?n b?t k? v?n ?? g m qu v? c v?i chuyn gia ch?m [place] s?c kh?e c?a qu v?.   Document Released: 06/22/2005 Document Revised: 02/22/2013 Elsevier Interactive Patient Education [employer].    Emergency Department Resource Guide 1) Find a Tax adviser and Pay Out of Pocket Although you won't have to find out who is covered by your insurance plan, it is a good idea to ask around and get recommendations. You will then need to call the office and see if the doctor you have chosen will accept you as a new patient and what types of options they offer for patients who are self-pay. Some doctors offer discounts or will set up payment plans for their patients who do not have insurance, but you will need to ask so you aren't surprised when you get to your appointment.  2) Contact Your Local Health Department Not all health departments have doctors that can see patients for sick visits, but many do, so it is worth a call to see if yours does. If you don't know where your local health department is, you can check in your phone book. The CDC also has a tool to help you locate your state's health department, and many state websites also have listings of all of their local health departments.  3) Find a [facility] If your illness is not likely to be very severe or complicated, you may want to try a walk in clinic. These are popping up all over the country in pharmacies, drugstores, and shopping centers. They're usually staffed by nurse practitioners or physician assistants that have been trained to treat common illnesses and complaints. They're usually fairly quick and inexpensive. However, if you have serious medical issues or chronic medical problems, these are probably not your best option.  No Primary Care Doctor: - Call Health Connect at  [phone] - they can help you locate a primary  care doctor that  accepts your insurance, provides certain services, etc. - Physician Referral Service-  [phone]  Chronic Pain Problems: Organization         Address  Phone   Notes  [facility]  [phone] Patients need to be referred by their primary care doctor.   Medication Assistance: Organization         Address  Phone   Notes  [facility] Medication [facility] [address] [place], [place] [address] [phone] --Must be a resident of [facility] -- Must have NO insurance coverage whatsoever (no Medicaid/ Medicare, etc.) -- The pt. MUST have a primary care doctor that directs their care regularly and follows them in the community   MedAssist  [phone]   [employer]  [phone]    Agencies that provide inexpensive medical care: Organization         Address  Phone   Notes  [place]  [phone]   [facility]    [phone]   [facility] [place], [place] [address] [phone]   [place] [address], [place] [phone]   Planned Parenthood    [phone]   [facility]    [phone]   [place] and [address], [place] Phone:  [phone], Fax:  [phone] Hours of Operation:  9 am - 6 pm, M-F.  Also accepts Medicaid/Medicare and self-pay.  [facility] for [place], [address], [place] Phone: [phone], Fax: [phone]. Hours of Operation:  8:30 am - 5:30 pm, M-F.  Also accepts Medicaid and self-pay.  [facility] [place] [address], [place] Phone: [phone]   [place], [place], [place] [phone], Ext. [phone] Mondays & Thursdays: 7-9 AM.  First 15 patients are seen on a first come, first serve basis.    [place]  Providers:  Organization         Address  Phone   Notes  [facility] [address], [place] [phone] Also accepts self-pay patients.  [facility] [address], [place]  [phone]   [place], [address], [place] [phone]   [facility] Family Medicine [address] [place] [phone]   Lucianne Lei [address], [place]   [phone] Only accepts [place] Access [place] patients after they have their name applied to their card.   Self-Pay (no insurance) in [facility]:  Organization         Address  Phone   Notes  Sickle Cell Patients, [facility] Internal Medicine [place] [phone]   [facility] [place] [phone]   [facility] [place]  [place], [place], [place] [phone]   [facility]/Dr. Osei-Bonsu  [address] [place] or [address], [place] [phone] Phone number for both [place] and [place] locations is the same.  [facility] [address], [place] [phone]   [facility] [address], [place] or [address] [phone]   [facility] [address], [place] [phone], phone; [phone], fax  Sees patients 1st and 3rd Saturday of every month.  Must not qualify for public or private insurance (i.e. Medicaid, Medicare, [place] Health Choice, Veterans' Benefits)  Household income should be no more than 200% of the poverty level The clinic cannot treat you if you are pregnant or think you are pregnant  Sexually transmitted diseases are not treated at the clinic.    Dental Care: Organization         Address  Phone  Notes  [facility] Department of [facility] [place] [phone] Accepts children up to age 50 who are enrolled in [place] or [place]; pregnant women with a Medicaid card; and children who have applied for Medicaid or [place] Health Choice, but were declined, whose parents can pay a reduced fee at time of service.  [facility] Department of [facility]  [address], [place] [phone] Accepts children up to age 74 who are enrolled in [place] or [place]; pregnant women with a Medicaid card; and children who have applied for Medicaid or [place] Health Choice, but were declined, whose parents can pay a reduced fee at time of service.  [place] Adult Dental Access PROGRAM  [place] [phone] Patients are seen by appointment only. Walk-ins are not accepted. [place] will see patients 25 years of age and older. Monday - Tuesday (8am-5pm) Most Wednesdays (8:30-5pm) $30 per visit, cash only  [facility] Adult Dental Access PROGRAM  [address], [facility] [phone] Patients are seen by appointment only. Walk-ins are not accepted. [place] will see patients 42 years of age and older. One Wednesday Evening (Monthly: Volunteer Based).  $30 per visit, cash only  [place]  [phone] for adults; Children under age 31, call Graduate Pediatric Dentistry at [phone]. Children aged 58-14, please call [phone] to request a pediatric application.  Dental services are provided in all areas of dental care including fillings, crowns and bridges, complete and partial dentures, implants, gum treatment, root canals, and extractions. Preventive care is also provided. Treatment is provided to both adults and children. Patients are selected via a lottery and there is often a waiting list.   [facility] [address] [place]  [phone] [email or web address]   [facility]  [address] [place], [place] [phone], Ext. [phone] Second and Fourth Thursday of each month, opens at 6:30 AM; Clinic ends at 9 AM.  Patients are seen on a first-come first-served basis, and a limited number are seen during each clinic.   [facility]  [address] Hillard Danker [place], [place] [phone]   Eligibility Requirements You must have lived in [place], [place], or [place] counties for at least the last three months.   You cannot be eligible for state or federal sponsored [employer], including [employer], [place], or [employer].   You generally cannot be eligible for healthcare insurance through your employer.    How to apply: Eligibility screenings are held every Tuesday and Wednesday afternoon from 1:00 pm until 4:00 pm. You do not need an appointment for the interview!  [facility] [address] [place], [place]   [place]  [place] Department  [place]  [phone]    Behavioral Health Resources in the Community: Intensive Outpatient  Development worker, international aid Health Services [address], [place], [place] [phone]   [facility] Outpatient [address], [place], [place]   ADS: Alcohol & Drug Svcs [address], [place], [place]   [place] [address],  [place], [place] or [phone]   Substance Abuse Resources Organization         Address  Phone  Notes  Al[facility] Drug Services  [phone]   [place]  [phone]   [facility] [place]   Chinita Pester  [phone]   Residential & Outpatient Substance Abuse Program  [phone]   Psychological Services Organization         Address  Phone  Notes  [facility]  [place]  [phone]   [place] [address] [place] or [phone]    Mobile Crisis Teams Organization         Address  Phone  Notes  [facility], Mobile Crisis Care Unit  [phone]   [facility]  [address] [place], [place]   [place] [address], [place] [phone]    Self-Help/Support Groups Organization         Address  Phone             Notes  [place]. of [place] - variety of support groups  [place] Call for more information  Narcotics Anonymous (NA), Caring Services [address], [employer] [place]  2 meetings at this location   Special educational needs teacher         Address  Phone  Notes  [facility]idential Treatment [place],    [place]  [phone]   [facility]  [address], [place] [address]84166, [place], [place]   [place], [place] [phone] Admissions: 8am-3pm M-F  Incentives Substance [place] [address]    [place], [place] [phone]   [facility] [address] [place], [place], [place]   [facility] [address]  [place], [place]   Insight Programs - Intensive Outpatient [address] Kristeen Mans [address], [place], [place]   [facility].,  [place], [place] [phone] or [phone]   [facility] Services (RTS) [address] [place], [place] Accepts Medicaid  [place] [address]  [place] [phone] Substance Abuse/Addiction Treatment   [facility] Organization         Address  Phone  Notes  [facility]  [phone]   Domenic Schwab, PhD [address] Arlis Porta [place], [place]   [phone] or [phone]    [place], [place] [phone]   [place] [address] [place], [place] [phone] Insurance/Medicaid/sponsorship through [facility] and Families [address], [place]                                    [place], [place] [phone] [place] [address], [place] [phone]    Dr.  Arfeen  [phone]   [facility] Dept. 1) [address] [place] 2) [place] 3)  [place] [address], [place] [phone]  [phone]   [place] [phone] or [phone] (After Hours)

## 2015-08-20 NOTE — ED Notes (Signed)
Pt left Ed, safe and with all belongings. Verbalized understanding of follow-up instructions.

## 2015-08-20 NOTE — ED Provider Notes (Signed)
CSN: [id number]     Arrival date & time 08/20/15  1127 History   First MD Initiated Contact with Patient 08/20/15 1150     Chief Complaint  Patient presents with  . Flank Pain     (Consider location/radiation/quality/duration/timing/severity/associated sxs/prior Treatment) The history is provided by the patient and medical records. No language interpreter was used.     Carolyn Bryan is a 45 y.o. female  with a hx of cholecystectomy, cesarean section presents to the Emergency Department complaining of waxing and waning mid back pain that radiates through her chest and underneath her ribs beginning at 6 AM this morning. She reports it occurs approximately once per hour and lasts approximately 30 minutes.  She denies flank pain, heavy lifting or new activities. Patient reports that nothing seems to aggravate or alleviate her pain. She reports that she has associated nausea and diaphoresis when the pain comes but this resolves with the pain. She denies previous history of cardiac disease.  Additionally patient complains of urinary frequency, dark urine and white vaginal discharge.  She denies dysuria, urinary urgency. She denies previous history of STD. She reports that sometimes her urine is "frothy." She denies fever, diarrhea, international travel, melena, hematochezia.   Past Medical History  Diagnosis Date  . Medical history non-contributory    Past Surgical History  Procedure Laterality Date  . Cesarean section    . Wisdom teeth abstracted    . Gall bladder removed    . Dilation and evacuation N/A 10/20/2012    Procedure: DILATATION AND EVACUATION;  Surgeon: Kirkland Hun, MD;  Location: [facility];  Service: Gynecology;  Laterality: N/A;   No family history on file. Social History  Substance Use Topics  . Smoking status: Never Smoker   . Smokeless tobacco: None  . Alcohol Use: No   OB History    Gravida Para Term Preterm AB TAB SAB Ectopic Multiple Living   Review of Systems  Constitutional: Positive for diaphoresis. Negative for fever, appetite change, fatigue and unexpected weight change.  HENT: Negative for mouth sores.   Eyes: Negative for visual disturbance.  Respiratory: Negative for cough, chest tightness, shortness of breath and wheezing.   Cardiovascular: Positive for chest pain.  Gastrointestinal: Positive for nausea and abdominal pain. Negative for vomiting, diarrhea and constipation.  Endocrine: Negative for polydipsia, polyphagia and polyuria.  Genitourinary: Positive for frequency and vaginal discharge. Negative for dysuria, urgency and hematuria.  Musculoskeletal: Positive for back pain. Negative for neck stiffness.  Skin: Negative for rash.  Allergic/Immunologic: Negative for immunocompromised state.  Neurological: Negative for syncope, light-headedness and headaches.  Hematological: Does not bruise/bleed easily.  Psychiatric/Behavioral: Negative for sleep disturbance. The patient is not nervous/anxious.       Allergies  Review of patient's allergies indicates no known allergies.  Home Medications   Prior to Admission medications   Medication Sig Start Date End Date Taking? Authorizing Provider  ferrous sulfate (FERROUSUL) 325 (65 FE) MG tablet Take 1 tablet (325 mg total) by mouth 2 (two) times daily with a meal. 10/20/12  Yes Kirkland Hun, MD  HYDROcodone-acetaminophen (NORCO/VICODIN) 5-325 MG tablet Take 1-2 tablets by mouth every 6 (six) hours as needed for moderate pain or severe pain. 08/20/15   Shayona Hibbitts, PA-C  ibuprofen (ADVIL,MOTRIN) 800 MG tablet Take 1 tablet (800 mg total) by mouth every 8 (eight) hours as needed for pain. 10/20/12   Kirkland Hun,  MD  lansoprazole (PREVACID) 30 MG capsule Take 1 capsule (30 mg total) by mouth daily at 12 noon. 06/29/14   Loren Racer, MD  ondansetron (ZOFRAN ODT) 8 MG disintegrating tablet 8mg  ODT q4 hours prn nausea 08/20/15   Sam Overbeck, PA-C   BP  124/86 mmHg  Pulse 93  Temp(Src) 98.3 F (36.8 C) (Oral)  Resp 25  Ht 5\' 2"  (1.575 m)  Wt 72.122 kg  BMI 29.07 kg/m2  SpO2 100%  LMP 07/19/2015 Physical Exam  Constitutional: She appears well-developed and well-nourished. No distress.  Awake, alert, nontoxic appearance  HENT:  Head: Normocephalic and atraumatic.  Mouth/Throat: Oropharynx is clear and moist. No oropharyngeal exudate.  Eyes: Conjunctivae are normal. No scleral icterus.  Neck: Normal range of motion. Neck supple.  Full ROM without pain  Cardiovascular: Normal rate, regular rhythm, normal heart sounds and intact distal pulses.   No murmur heard. Pulmonary/Chest: Effort normal and breath sounds normal. No respiratory distress. She has no wheezes.  Equal chest expansion  Abdominal: Soft. Bowel sounds are normal. She exhibits no distension and no mass. There is no tenderness. There is no rebound and no guarding. Hernia confirmed negative in the right inguinal area and confirmed negative in the left inguinal area.  Genitourinary: Uterus normal. No labial fusion. There is no rash, tenderness or lesion on the right labia. There is no rash, tenderness or lesion on the left labia. Uterus is not deviated, not enlarged, not fixed and not tender. Cervix exhibits no motion tenderness, no discharge and no friability. Right adnexum displays no mass, no tenderness and no fullness. Left adnexum displays no mass, no tenderness and no fullness. No erythema, tenderness or bleeding in the vagina. No foreign body around the vagina. No signs of injury around the vagina. Vaginal discharge (Thick, green, nonodorous coming from the cervical os) found.  Musculoskeletal: Normal range of motion. She exhibits no edema.  Full range of motion of the T-spine and L-spine Mild midline tenderness to the T-spine without paraspinal tenderness No spinal midline tenderness of the L-spine.  Lymphadenopathy:    She has no cervical adenopathy.       Right: No  inguinal adenopathy present.       Left: No inguinal adenopathy present.  Neurological: She is alert. She has normal reflexes.  Reflex Scores:      Bicep reflexes are 2+ on the right side and 2+ on the left side.      Brachioradialis reflexes are 2+ on the right side and 2+ on the left side.      Patellar reflexes are 2+ on the right side and 2+ on the left side.      Achilles reflexes are 2+ on the right side and 2+ on the left side. Speech is clear and goal oriented Moves extremities without ataxia  Skin: Skin is warm and dry. No rash noted. She is not diaphoretic. No erythema.  Psychiatric: She has a normal mood and affect. Her behavior is normal.  Nursing note and vitals reviewed.   ED Course  Procedures (including critical care time) Labs Review Labs Reviewed  WET PREP, GENITAL - Abnormal; Notable for the following:    WBC, Wet Prep HPF POC MANY (*)    All other components within normal limits  URINALYSIS, ROUTINE W REFLEX MICROSCOPIC (NOT AT [facility]) - Abnormal; Notable for the following:    Hgb urine dipstick TRACE (*)    All other components within normal limits  CBC - Abnormal; Notable for  the following:    WBC 10.6 (*)    RBC 5.42 (*)    MCV 68.5 (*)    MCH 22.5 (*)    All other components within normal limits  LIPASE, BLOOD - Abnormal; Notable for the following:    Lipase 100 (*)    All other components within normal limits  HEPATIC FUNCTION PANEL - Abnormal; Notable for the following:    Albumin 3.2 (*)    AST 77 (*)    All other components within normal limits  URINE MICROSCOPIC-ADD ON - Abnormal; Notable for the following:    Squamous Epithelial / LPF 0-5 (*)    Bacteria, UA RARE (*)    All other components within normal limits  BASIC METABOLIC PANEL  D-DIMER, QUANTITATIVE (NOT AT [facility])  POC URINE PREG, ED  I-STAT TROPOININ, ED  GC/CHLAMYDIA PROBE AMP ([place]) NOT AT [facility]    Imaging Review Dg Chest 2 View  08/20/2015  CLINICAL DATA:  Acute chest  pain. EXAM: CHEST  2 VIEW COMPARISON:  None. FINDINGS: The heart size and mediastinal contours are within normal limits. Both lungs are clear. No pneumothorax or pleural effusion is noted. The visualized skeletal structures are unremarkable. IMPRESSION: No active cardiopulmonary disease. Electronically Signed   By: Lupita Raider, M.D.   On: 08/20/2015 14:01   Dg Thoracic Spine 2 View  08/20/2015  CLINICAL DATA:  Back and flank pain since 6 a.m. this morning. No known injury. Initial encounter. EXAM: THORACIC SPINE 2 VIEWS COMPARISON:  None. FINDINGS: There is no evidence of thoracic spine fracture. Alignment is normal. No other significant bone abnormalities are identified. IMPRESSION: Negative exam. Electronically Signed   By: Drusilla Kanner M.D.   On: 08/20/2015 14:01   US Abdomen Complete  08/20/2015  CLINICAL DATA:  Epigastric pain, nausea, diaphoresis since 6 a.m. this morning EXAM: ABDOMEN ULTRASOUND COMPLETE COMPARISON:  02/10/2010 FINDINGS: Gallbladder: Prior cholecystectomy Common bile duct: Diameter: Normal for post cholecystectomy state, 6.8 mm. Liver: No focal lesion identified. Within normal limits in parenchymal echogenicity. IVC: No abnormality visualized. Pancreas: Visualized portion unremarkable. Spleen: Size and appearance within normal limits. Right Kidney: Length: 11.0 cm. Echogenicity within normal limits. No mass or hydronephrosis visualized. Left Kidney: Length: 11.7 cm. Echogenicity within normal limits. No mass or hydronephrosis visualized. Abdominal aorta: No aneurysm visualized. Other findings: None. IMPRESSION: Prior cholecystectomy.  No acute findings. Electronically Signed   By: Charlett Nose M.D.   On: 08/20/2015 14:10   I have personally reviewed and evaluated these images and lab results as part of my medical decision-making.   EKG Interpretation   Date/Time:  Tuesday August 20 2015 14:30:15 EST Ventricular Rate:  70 PR Interval:  170 QRS Duration: 88 QT  Interval:  431 QTC Calculation: 465 R Axis:   37 Text Interpretation:  normal sinus rhythm  Low voltage, precordial leads  Abnormal R-wave progression, early transition Borderline T abnormalities,  anterior leads No significant change was found Confirmed by CAMPOS  MD,  Caryn Bee ([id number]) on 08/20/2015 3:27:23 PM     MDM   Final diagnoses:  Vaginal discharge  Acute pancreatitis, unspecified pancreatitis type  Concern about STD in female without diagnosis    Melana C Dierolf presents with back pain that radiates to the chest and under the ribs.  Pertinent negative, d-dimer negative, no leukocytosis. Electrolyte normal. Lipase 100 indicative of pancreatitis. Ultrasound shows postcholecystectomy as expected but no dilatation of the common bile duct.   Negative pregnancy test - this  did not cross over from mini lab, but was confirmed.  Pelvic exam with vaginal discharge concerning for potential STD. Gonorrhea and Chlamydia cultures pending. Patient treated prophylactically here in the emergency department. No adnexal or cervical motion tenderness to suggest PID. Patient is afebrile without tachycardia or hypotension.  Prep with many white blood cells.  Repeat exam abdomen remained soft and nontender. Patient feels well and will be discharged home.      Dahlia Client Maciah Schweigert, PA-C 08/20/15 1546  Azalia Bilis, MD 08/20/15 [phone]

## 2015-08-21 LAB — GC/CHLAMYDIA PROBE AMP (~~LOC~~) NOT AT ARMC
CHLAMYDIA, DNA PROBE: NEGATIVE
NEISSERIA GONORRHEA: NEGATIVE

## 2015-09-25 ENCOUNTER — Inpatient Hospital Stay (HOSPITAL_COMMUNITY): Payer: BLUE CROSS/BLUE SHIELD

## 2015-09-25 ENCOUNTER — Encounter (HOSPITAL_COMMUNITY): Payer: Self-pay | Admitting: *Deleted

## 2015-09-25 ENCOUNTER — Other Ambulatory Visit: Payer: Self-pay | Admitting: Obstetrics and Gynecology

## 2015-09-25 ENCOUNTER — Inpatient Hospital Stay (HOSPITAL_COMMUNITY)
Admission: AD | Admit: 2015-09-25 | Discharge: 2015-09-25 | Disposition: A | Discharge status: Home / Self Care | Payer: BLUE CROSS/BLUE SHIELD | Source: Ambulatory Visit | Attending: Obstetrics and Gynecology | Admitting: Obstetrics and Gynecology

## 2015-09-25 DIAGNOSIS — O210 Mild hyperemesis gravidarum: Secondary | ICD-10-CM | POA: Insufficient documentation

## 2015-09-25 DIAGNOSIS — O34219 Maternal care for unspecified type scar from previous cesarean delivery: Secondary | ICD-10-CM | POA: Diagnosis not present

## 2015-09-25 DIAGNOSIS — K5900 Constipation, unspecified: Secondary | ICD-10-CM | POA: Insufficient documentation

## 2015-09-25 DIAGNOSIS — Z98891 History of uterine scar from previous surgery: Secondary | ICD-10-CM

## 2015-09-25 DIAGNOSIS — O09521 Supervision of elderly multigravida, first trimester: Secondary | ICD-10-CM | POA: Insufficient documentation

## 2015-09-25 DIAGNOSIS — Z3A01 Less than 8 weeks gestation of pregnancy: Secondary | ICD-10-CM | POA: Diagnosis not present

## 2015-09-25 DIAGNOSIS — Z8742 Personal history of other diseases of the female genital tract: Secondary | ICD-10-CM

## 2015-09-25 DIAGNOSIS — O3680X0 Pregnancy with inconclusive fetal viability, not applicable or unspecified: Secondary | ICD-10-CM

## 2015-09-25 DIAGNOSIS — O219 Vomiting of pregnancy, unspecified: Secondary | ICD-10-CM

## 2015-09-25 DIAGNOSIS — O039 Complete or unspecified spontaneous abortion without complication: Secondary | ICD-10-CM | POA: Diagnosis not present

## 2015-09-25 LAB — URINALYSIS, ROUTINE W REFLEX MICROSCOPIC
BILIRUBIN URINE: NEGATIVE
Glucose, UA: NEGATIVE mg/dL
Ketones, ur: 15 mg/dL — AB
Leukocytes, UA: NEGATIVE
Nitrite: NEGATIVE
PH: 6 (ref 5.0–8.0)
Protein, ur: NEGATIVE mg/dL
SPECIFIC GRAVITY, URINE: 1.02 (ref 1.005–1.030)

## 2015-09-25 LAB — POCT PREGNANCY, URINE: PREG TEST UR: POSITIVE — AB

## 2015-09-25 LAB — URINE MICROSCOPIC-ADD ON

## 2015-09-25 MED ORDER — LACTATED RINGERS IV SOLN
INTRAVENOUS | Status: DC
Start: 2015-09-25 — End: 2015-09-25

## 2015-09-25 MED ORDER — ONDANSETRON HCL 4 MG/2ML IJ SOLN
4.0000 mg | Freq: Once | INTRAMUSCULAR | Status: AC
  Administered 2015-09-25: 4 mg via INTRAVENOUS
  Filled 2015-09-25: qty 2

## 2015-09-25 MED ORDER — PANTOPRAZOLE SODIUM 40 MG IV SOLR
40.0000 mg | Freq: Once | INTRAVENOUS | Status: AC
  Administered 2015-09-25: 40 mg via INTRAVENOUS
  Filled 2015-09-25: qty 40

## 2015-09-25 MED ORDER — LACTATED RINGERS IV BOLUS (SEPSIS)
500.0000 mL | Freq: Once | INTRAVENOUS | Status: AC
  Administered 2015-09-25: 500 mL via INTRAVENOUS

## 2015-09-25 NOTE — MAU Provider Note (Signed)
History   45 yo [id number] at 7 5/7 weeks by [place] at 5 6/7 weeks presented after calling office with report of inability to keep any f/f down x 3 days.  Had been prescribed Zofran, but little benefit.  Seen at [facility] on 09/12/15 for pelvic pain, with QHCG of 1753, and SIUP of 5 6/7 weeks, with FHR 119, see that day.  Patient reports it has been 10 days since her last BM.  Denies bleeding, fever, dysuria, or any other sx.  Patient Active Problem List   Diagnosis Date Noted  . Previous cesarean section--subsequent VBACs x 3 09/25/2015  . SAB (spontaneous abortion) x 3 09/25/2015  . Hx of abnormal cervical Pap smear--LGSIL 2014 09/25/2015  . Advanced maternal age (AMA), 40 years or greater 09/19/2012    Chief Complaint  Patient presents with  . Nausea   HPI:  See above  OB History    Gravida Para Term Preterm AB TAB SAB Ectopic Multiple Living   Past Medical History  Diagnosis Date  . Medical history non-contributory     Past Surgical History  Procedure Laterality Date  . Cesarean section    . Wisdom teeth abstracted    . Gall bladder removed    . Dilation and evacuation N/A 10/20/2012    Procedure: DILATATION AND EVACUATION;  Surgeon: Kirkland Hun, MD;  Location: [facility];  Service: Gynecology;  Laterality: N/A;    No family history on file.  Social History  Substance Use Topics  . Smoking status: Never Smoker   . Smokeless tobacco: None  . Alcohol Use: No    Allergies: No Known Allergies  Prescriptions prior to admission  Medication Sig Dispense Refill Last Dose  . ferrous sulfate (FERROUSUL) 325 (65 FE) MG tablet Take 1 tablet (325 mg total) by mouth 2 (two) times daily with a meal. 100 tablet 1 08/20/2015 at Unknown time  . HYDROcodone-acetaminophen (NORCO/VICODIN) 5-325 MG tablet Take 1-2 tablets by mouth every 6 (six) hours as needed for moderate pain or severe pain. 15 tablet 0   . ibuprofen (ADVIL,MOTRIN) 800 MG tablet Take 1 tablet (800 mg total)  by mouth every 8 (eight) hours as needed for pain. 50 tablet 1   . lansoprazole (PREVACID) 30 MG capsule Take 1 capsule (30 mg total) by mouth daily at 12 noon. 30 capsule 0   . ondansetron (ZOFRAN ODT) 8 MG disintegrating tablet  ODT q4 hours prn nausea 4 tablet 0     ROS:  N/V, constipation Physical Exam   Blood pressure 131/76, pulse 83, temperature 98 F (36.7 C), resp. rate 18, weight 77.565 kg (171 lb), last menstrual period 07/19/2015, unknown if currently breastfeeding.    Physical Exam In mild discomfort with gaseous abdominal pain Chest clear Heart RRR without murmur Abd mildly distended, + bowel sounds, no rebound or guarding, mild tenderness to palpation Pelvic--deferred Ext WNL   ED Course  Assessment: Early pregnancy N/V Constipation  Plan: IV hydration Zofran  Protonix OB US for viability    Nigel Bridgeman CNM, MSN 09/25/2015 4:19 PM   Addendum: Feeling better after IV hydration--taking po fluids and crackers.  [place]: IMPRESSION: Single live intrauterine gestation with estimated gestational age of 82- weeks. Small subchorionic hemorrhage. No maternal extrauterine pelvic or adnexal mass. No free pelvic fluid.  Results for orders placed or performed during the hospital encounter of 09/25/15 (from the past 24 hour(s))  Urinalysis,  Routine w reflex microscopic (not at [facility])     Status: Abnormal   Collection Time: 09/25/15  2:27 PM  Result Value Ref Range   Color, Urine YELLOW YELLOW   APPearance CLEAR CLEAR   Specific Gravity, Urine 1.020 1.005 - 1.030   pH 6.0 5.0 - 8.0   Glucose, UA NEGATIVE NEGATIVE mg/dL   Hgb urine dipstick MODERATE (A) NEGATIVE   Bilirubin Urine NEGATIVE NEGATIVE   Ketones, ur 15 (A) NEGATIVE mg/dL   Protein, ur NEGATIVE NEGATIVE mg/dL   Nitrite NEGATIVE NEGATIVE   Leukocytes, UA NEGATIVE NEGATIVE  Urine microscopic-add on     Status: Abnormal   Collection Time: 09/25/15  2:27 PM  Result Value Ref Range   Squamous  Epithelial / LPF 0-5 (A) NONE SEEN   WBC, UA 0-5 0 - 5 WBC/hpf   RBC / HPF 0-5 0 - 5 RBC/hpf   Bacteria, UA FEW (A) NONE SEEN  Pregnancy, urine POC     Status: Abnormal   Collection Time: 09/25/15  2:56 PM  Result Value Ref Range   Preg Test, Ur POSITIVE (A) NEGATIVE   D/C'd home. Discussed Zofran's SE of constipation. Recommended Dulcolox suppositories prn. If no benefit, to f/u with [facility].  Schedule NOB w/u ASAP.  Nigel BridgemanVicki Ascencion Stegner, CNM 09/25/15 6p

## 2015-09-25 NOTE — MAU Note (Signed)
Pt presents to MAU with complaints of nausea and vomiting for 3 days, Denies any vaginal bleeding or abnormal discharge

## 2015-09-25 NOTE — Discharge Instructions (Signed)
Nn nghn (Hyperemesis Gravidarum) Ch?ng nn nghn l m?t d?ng n?ng c?a bu?n nn v nn n?ng x?y ra trong th?i k? mang [place]. Ch?ng nn t?i t? h?n tnh tr?ng ?m nghn. N c th? khi?n cho qu v? b? bu?n nn ho?c nn su?t c? ngy trong nhi?u ngy. N c th? khi?n cho qu v? khng th? ?n v u?ng ?? th?c ?n v ch?t l?ng. Ch?ng nn th??ng x?y ra trong n?a ??u (20 tu?n ??u) c?a [place]?. N th??ng h?t sau khi ng??i mang [place] ? n?a sau c?a [place]?. Tuy nhin, ?i khi ch?ng nn ti?p t?c sut ton b? thai k?.  NGUYN NHN.  Nguyn nhn c?a tnh tr?ng ny khng hon ton ???c xc ??nh nh?ng ???c cho l lin quan ??n s? thay ??i hocmon c?a c? th? khi mang [place]. N c th? do n?ng ?? hocmon thai nghn cao ho?c t?ng estrogen trong c? th?.  D?U HI?U V TRI?U CH?NG   Bu?n nn v nn m?a n?ng.  Bu?n nn khng h?t.  Nn m?a khi?n qu v? khng th? gi? ???c b?t k? th?c ?n no trong c? th?.  Gi?m cn v m?t n??c trong c? th? (m?t n??c).  Khng mu?n ?n ho?c khng thch th?c ?n m tr??c ?y qu v? r?t thch. CH?N ?ON  Chuyn gia ch?m [place] s?c kh?e c?a qu v? s? khm th?c th? v h?i v? nh?ng tri?u ch?ng c?a qu v?. Chuyn gia ch?m [place] s?c kh?e c?ng c th? yu c?u xt nghi?m mu v xt nghi?m n??c ti?u ?? ??m b?o khng c nguyn nhn no khc gy ra v?n ?? ny.  ?I?U TR?  Qu v? c th? ch? c?n thu?c ?? ki?m sot v?n ?? ny. N?u thu?c khng ki?m sot ???c hi?n t??ng bu?n nn v nn m?a, qu v? s? ???c ?i?u tr? trong b?nh vi?n ?? ng?n ch?n tnh tr?ng m?t n??c, t?ng a xt trong mu (nhi?m toan), s?t cn, v nh?ng thay ??i v? ?i?n gi?i trong c? th? c th? gy h?i cho em b trong b?ng m? (bo [place]). Qu v? c th? c?n truy?n d?ch ???ng t?nh m?ch (IV).  H??NG D?N CH?M [place] T?I NH   Ch? s? d?ng thu?c khng c?n k ??n ho?c thu?c c?n k ??n theo ch? d?n c?a chuyn gia ch?m [place] s?c kh?e.  Hy c? g?ng ?n m?t vi chi?c bnh quy ho?c bnh m n??ng vo bu?i sng tr??c khi ra kh?i gi??ng.  Trnh th?c ?n v mi lm d? dy kh  ch?u.  Trnh th?c ?n bo v nhi?u gia v?.  ?n 5 - 6 b?a ?n nh? m?i ngy.  Khng u?ng trong khi ?n. U?ng gi?a cc b?a ?n.  ??i v?i ?? ?n nh?, hy ?n nh?ng lo?i th?c ph?m ch?a nhi?u protein, ch?ng h?n nh? pho mt.  ?n ho?c ht nh?ng ?? ?n, ?? u?ng c g?ng. G?ng gip lm gi?m c?m gic bu?n nn.  Trnh chu?n b? ?? ?n. Mi th?c ?n c th? ?nh h??ng ??n kh?u v? c?a qu v?.  Trnh dng vin s?t v s?t trong vitamin t?ng h?p c?a qu v? cho ??n sau 3 - 4 thng mang [place]. Tuy nhin, hy tham kh?o  ki?n c?a chuyn gia ch?m [place] s?c kh?e tr??c khi d?ng b?t k? lo?i thu?c c s?t no ? k ??n. ?I KHM N?U:   C?n ?au b?ng c?a qu v? t?ng ln.  Qu v? b? ?au ??u nhi?u.  Qu v? c v?n ?? v?  th? l?c.  Qu v? b? s?t cn. NGAY L?P T?C ?I KHM N?U:   Qu v? khng th? gi? ???c ch?t l?ng trong ng??i.  Qu v? nn ra mu.  Qu v? b? bu?n nn v nn m?a lin t?c.  Qu v? r?t m?t m?i.  Qu v? r?t kht n??c.  Qu v? b? chng m?t ho?c b? ng?t x?u.  Qu v? b? s?t ho?c c cc tri?u ch?ng ko di h?n 2 - 3 ngy.  Qu v? b? s?t v cc tri?u ch?ng c?a qu v? ??t nhin n?ng h?n. ??M B?O QU V?:   Hi?u r cc h??ng d?n ny.  S? theo di tnh tr?ng c?a mnh.  S? yu c?u tr? gip ngay l?p t?c n?u qu v? c?m th?y khng kh?e ho?c th?y tr?m tr?ng h?n.   Thng tin ny khng nh?m m?c ?ch thay th? cho l?i khuyn m chuyn gia ch?m [place] s?c kh?e ni v?i qu v?. Hy b?o ??m qu v? ph?i th?o lu?n b?t k? v?n ?? g m qu v? c v?i chuyn gia ch?m [place] s?c kh?e c?a qu v?.   Document Released: 06/22/2005 Document Revised: 04/12/2013 Elsevier Interactive Patient Education 2016 [employer].  To bn, Ng??i l?n (Constipation, Adult) To bn l khi m?t ng??i ?i ??i ti?n t h?n ba l?n trong m?t tu?n, ??i ti?n kh kh?n, ho?c ??i ti?n ra phn kh, c?ng, ho?c to h?n bnh th??ng. Khi tu?i cng cao th cng d? b? to bn. Ch? ?? ?n thi?u ch?t x?, khng u?ng ?? n??c, v dng m?t s? lo?i thu?c nh?t ??nh c th? lm to  bo n?ng thm.  NGUYN NHN.   M?t s? lo?i thu?c nh?t ??nh nh? thu?c ch?ng tr?m c?m, thu?c gi?m ?au, thu?c c b? sung s?t, thu?c trung ha axit d?ch v?, v thu?c l?i ti?u.  M?t s? b?nh l nh?t ??nh nh? ti?u ???ng, h?i ch?ng ru?t kch thch (IBS), b?nh c?a tuy?n gip, ho?c tr?m c?m.  Khng u?ng ?? n??c.  Khng ?n ?? th?c ?n giu ch?t x?.  C?ng th?ng ho?c do ?i l?i.  t ho?t ??ng thn th? ho?c th? d?c.  Nh?n ?i ??i ti?n.  S? d?ng qu nhi?u thu?c nhu?n trng. D?U HI?U V TRI?U CH?NG   ?i ??i ti?n t h?n ba l?n m?i tu?n.  Ph?i r?n m?nh ?? ??i ti?n.  ??i ti?n ra phn c?ng, kh, ho?c to h?n bnh th??ng.  C?m th?y ??y b?ng ho?c ch??ng b?ng.  ?au ? vng b?ng d??i.  Khng c?m th?y tho?i mi sau khi ??i ti?n. CH?N ?ON  Chuyn gia ch?m [place] s?c kh?e c?a qu v? s? h?i v? b?nh s? v khm th?c th? cho qu v?. C th? c?n ki?m tra thm trong tr??ng h?p to bn n?ng. M?t s? ki?m tra c th? bao g?m:  Ch?p X quang c ch?t c?n quang ?? ki?m tra tr?c trng, ??i trng, v ?i khi c? ru?t non c?a qu v?.  N?i soi tr?c trng sigma ?? ki?m tra ph?n pha d??i c?a ??i trng.  Th? thu?t soi ??i trng ?? khm ton b? ??i trng. ?I?U TR?  ?i?u tr? ty thu?c vo m?c ?? tr?m tr?ng c?a ch?ng to bn v nguyn nhn gy to bn. ?i?u tr? thng qua ch? ?? ?n u?ng bao g?m u?ng nhi?u n??c h?n v ?n th?c ?n c nhi?u ch?t x? h?n. ?i?u tr? thng qua l?i s?ng c th? bao g?m vi?c t?p th? d?c th??ng xuyn. N?u nh?ng khuy?n ngh?  v? ch? ?? ?n v l?i s?ng khng c tc d?ng, chuyn gia ch?m [place] s?c kh?e c th? khuy?n ngh? qu v? dng cc lo?i thu?c nhu?n trng khng c?n k ??n ?? gip qu v? ??i ti?n. C th? ph?i k ??n thu?c c?n k ??n n?u thu?c khng c?n k ??n khng c tc d?ng.  H??NG D?N CH?M [place] T?I NH   ?n th?c ?n c nhi?u ch?t x? nh? tri cy, rau, ng? c?c nguyn h?t, v cc lo?i ??u.  H?n ch? th?c ?n c nhi?u ch?t bo v ???ng ch? bi?n s?n, ch?ng h?n khoai ty chin, bnh hamburger, bnh quy, k?o, v  soda.  C th? dng th?c ph?m ch?c n?ng c b? sung ch?t x? vo kh?u ph?n ?n c?a qu v? n?u qu v? khng th? dng ?? ch?t x? t? th?c ?n.  U?ng ?? n??c ?? gi? cho n??c ti?u trong ho?c vng nh?t.  T?p th? d?c th??ng xuyn ho?c theo ch? d?n c?a chuyn gia ch?m [place] s?c kh?e.  Vo nh v? sinh ngay khi qu v? c nhu c?u. Khng nh?n ?i ??i ti?n.  Ch? s? d?ng thu?c khng c?n k ??n ho?c thu?c c?n k ??n theo ch? d?n c?a chuyn gia ch?m [place] s?c kh?e.Khng dng cc lo?i thu?c ch?ng to bn khc m khng bn b?c tr??c v?i chuyn gia ch?m [place] s?c kh?e. NGAY L?P T?C ?I KHM N?U:   ?i ??i ti?n ra mu ?? t??i.  Ch?ng to bn ko di h?n 4 ngy v tr?m tr?ng h?n.  Qu v? b? ?au b?ng ho?c ?au tr?c trng.  Phn c?a qu v? m?ng, trng nh? bt ch.  Qu v? b? s?t cn khng r nguyn nhn. ??M B?O QU V?:   Hi?u r cc h??ng d?n ny.  S? theo di tnh tr?ng c?a mnh.  S? yu c?u tr? gip ngay l?p t?c n?u qu v? c?m th?y khng kh?e ho?c th?y tr?m tr?ng h?n.   Thng tin ny khng nh?m m?c ?ch thay th? cho l?i khuyn m chuyn gia ch?m [place] s?c kh?e ni v?i qu v?. Hy b?o ??m qu v? ph?i th?o lu?n b?t k? v?n ?? g m qu v? c v?i chuyn gia ch?m [place] s?c kh?e c?a qu v?.   Document Released: 10/07/2010 Document Revised: 07/13/2014 Elsevier Interactive Patient Education [employer]

## 2015-12-24 ENCOUNTER — Other Ambulatory Visit (HOSPITAL_COMMUNITY): Payer: Self-pay | Admitting: Obstetrics and Gynecology

## 2015-12-24 DIAGNOSIS — Z3689 Encounter for other specified antenatal screening: Secondary | ICD-10-CM

## 2015-12-24 DIAGNOSIS — Z3A21 21 weeks gestation of pregnancy: Secondary | ICD-10-CM

## 2015-12-26 ENCOUNTER — Encounter (HOSPITAL_COMMUNITY): Payer: Self-pay

## 2015-12-27 ENCOUNTER — Ambulatory Visit (HOSPITAL_COMMUNITY)
Admission: RE | Admit: 2015-12-27 | Discharge: 2015-12-27 | Disposition: A | Discharge status: Home / Self Care | Payer: BLUE CROSS/BLUE SHIELD | Source: Ambulatory Visit | Attending: Obstetrics and Gynecology | Admitting: Obstetrics and Gynecology

## 2015-12-27 ENCOUNTER — Ambulatory Visit (HOSPITAL_COMMUNITY)
Admission: RE | Admit: 2015-12-27 | Payer: BLUE CROSS/BLUE SHIELD | Source: Ambulatory Visit | Attending: Obstetrics & Gynecology | Admitting: Obstetrics & Gynecology

## 2015-12-27 ENCOUNTER — Other Ambulatory Visit (HOSPITAL_COMMUNITY): Payer: Self-pay | Admitting: Obstetrics and Gynecology

## 2015-12-27 ENCOUNTER — Encounter (HOSPITAL_COMMUNITY): Payer: Self-pay

## 2015-12-27 DIAGNOSIS — O359XX0 Maternal care for (suspected) fetal abnormality and damage, unspecified, not applicable or unspecified: Secondary | ICD-10-CM

## 2015-12-27 DIAGNOSIS — Z3689 Encounter for other specified antenatal screening: Secondary | ICD-10-CM

## 2015-12-27 DIAGNOSIS — O09522 Supervision of elderly multigravida, second trimester: Secondary | ICD-10-CM | POA: Insufficient documentation

## 2015-12-27 DIAGNOSIS — O365920 Maternal care for other known or suspected poor fetal growth, second trimester, not applicable or unspecified: Secondary | ICD-10-CM

## 2015-12-27 DIAGNOSIS — Z3A21 21 weeks gestation of pregnancy: Secondary | ICD-10-CM

## 2015-12-27 DIAGNOSIS — Z36 Encounter for antenatal screening of mother: Secondary | ICD-10-CM | POA: Insufficient documentation

## 2015-12-27 LAB — ROUTINE CHROMOSOME - KARYOTYPE + FISH

## 2015-12-27 LAB — AP-AFP (ALPHA FETOPROTEIN)

## 2015-12-27 NOTE — Progress Notes (Signed)
Genetic Counseling  High-Risk Gestation Note  Appointment Date:  12/27/2015 Referred By: Delsa Bern, MD Date of Birth:  12/05/1970   Pregnancy History: [id number] Estimated Date of Delivery: 05/08/16 Estimated Gestational Age: 9w0dAttending: MRenella Cunas MD   I met with [name] genetic counseling because of advanced maternal age and abnormal ultrasound findings.  In summary:  Discussed ultrasound findings in detail  Fetal heart defect, choroid plexus cysts, rocker bottom feet, clenched hands, absent fetal kidney; Additional ultrasound findings and more detailed description listed in separate ultrasound report.   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 Reviewed possible risks for other underlying chromosome or single gene conditions  Spent time discussing trisomy 170including variable features and poor prognosis  Patient expressed that she was considering termination of pregnancy  She also expressed during other times in visit that she would continue with the pregnancy  Discussed that [redacted] weeks gestation [place] of [place] options for [place]  Reviewed options for additional screening  NIPS-declined  Ongoing ultrasound  Reviewed options for diagnostic testing, including risks, benefits, limitations and alternatives  [facility]  Reviewed family history concerns  Ms. Carolyn Monacowas sent for ultrasound and consultation today regarding abnormal ultrasound findings.  Ultrasound today revealed multiple abnormal findings including: suspected fetal heart defect, choroid plexus cysts, clenched hands, rocker bottom feet, and absent kidney.  Complete ultrasound results reported under separate cover.   We discussed these findings in detail.  Specifically, we discussed that congenital anomalies can occur as isolated,  nonsyndromic birth defects, or as features of an underlying genetic syndrome.  The risk for a genetic etiology increases with the presence of multiple fetal anatomic differences.  Based on the combination of ultrasound findings, and considering Carolyn Bryan's age related risk for fetal aneuploidy, the adjusted risk for fetal aneuploidy is significantly increased. We discussed that the constellation of findings is most suggestive of trisomy 123  We reviewed chromosomes, nondisjunction, and the common features of Down syndrome, trisomy 123 and trisomy 152  In addition, we briefly discussed the risk for other chromosome aberrations including microdeletions (22q11 del syndrome), duplications, insertions, and translocations. Given the high suspicion for trisomy 18, we spent time reviewing the variable features and poor prognosis of this condition in detail. Ms. TBromanwas also provided with written information regarding chromosomes and trisomy 153     Ms. TJackowskiwas then counseled regarding the availability of amniocentesis (at 15+ weeks gestation) including the associated risks, benefits, and limitations.  She understands that chromosome analysis can be performed both prenatally (amniotic fluid) and postnatally (peripheral blood or cord blood).   She was counseled regarding the option of noninvasive prenatal screening (NIPS).  We reviewed that this technology evaluates fragments of fetal (placental) DNA found in maternal circulation to predict the chance of specific chromosome conditions in the fetus.  Although highly specific and sensitive, this testing is not considered diagnostic.  We reviewed that NIPS specifically assesses the risk of fetal Down syndrome, trisomy 181 trisomy 149 fetal sex chromosome aneuploidy, triploidy, and select microdeletions (22q11 deletion syndrome); currently, this technology cannot assess the risk for all chromosome aberrations or single gene conditions. We discussed the possible results that  the tests might provide including: positive, negative, unanticipated, and no result. Finally, she was counseled regarding the cost of each option and potential out of pocket expenses.  After thoughtful consideration of their options, Carolyn Bryan elected to have amniocentesis today for [facility] for aneuploidy and karyotype analysis. See separate ultrasound report.   Other possible explanations for the above discussed ultrasound findings including single gene conditions.  Single gene conditions are typically tested for postnatally, based on the recommendation of a medical geneticist, unless ultrasound findings or the family history are strongly suggestive of a specific syndrome.  We discussed that multiple congenital anomalies can also result from teratogenic exposures.  Carolyn Bryan denied the use of illicit substances, medications, or alcohol during this pregnancy. She reported a chest x-ray at [facility], which occurred prior to being aware of the pregnancy on 08/20/15. Based on the reported [facility] for the pregnancy, this date would have been expected to be prior to conception and would not be expected to increase the risk for adverse effects in pregnancy.   Carolyn Bryan was counseled that the prognosis and postnatal management depend on the underlying etiology of the fetal differences.  She was counseled that the prognosis is expected to be poor based on the multiple findings and the suggestion of trisomy 66.  We then discussed the option of serial sonography, fetal echocardiogram, and a postnatal consultation with a medical geneticist, if warranted.  We also discussed the option of continuing the pregnancy versus termination of pregnancy.  We discussed that pregnancy termination is a legal option in [place] until [redacted] weeks gestation.  We discussed the options of D&E and induction. We reviewed options of out of state termination of pregnancy including [place] and [place] Carolyn Bryan expressed  varying views on whether or not she would consider termination or pregnancy but indicated several times that in the case that a specific condition is diagnosed, she would likely consider termination of pregnancy and expressed understanding that this would not likely be able to be accommodated in [place].   Both family histories were reviewed and found to be noncontributory for birth defects, intellectual disability, and known genetic conditions. Ms. Monk does not have information regarding her family history, given that she has not had contact with her family in [place] for 30 years. Without further information regarding the provided family history, an accurate genetic risk cannot be calculated. Further genetic counseling is warranted if more information is obtained.  Ms. Polito denied significant viral illnesses during the course of her pregnancy. Her medical and surgical histories were noncontributory.   I counseled Mrs. Derrek Bryan regarding the above risks and available options. The approximate face-to-face time with the genetic counselor was 50 minutes.  Chipper Oman, MS,  Certified [employer] 12/27/2015

## 2015-12-31 ENCOUNTER — Telehealth (HOSPITAL_COMMUNITY): Payer: Self-pay | Admitting: MS"

## 2015-12-31 ENCOUNTER — Other Ambulatory Visit (HOSPITAL_COMMUNITY): Payer: Self-pay

## 2015-12-31 NOTE — Telephone Encounter (Signed)
Patient's son, Carolyn Bryan, returned call to stated that his mother contacted [facility] [place] And would like for her medical records and insurance information to be faxed to them. She is planning to schedule an appointment with them for termination of pregnancy. [name] also asked for his brother's phone number (patient's other son) to be added to patient's contact information. Phone number is [phone].   Clydie BraunKaren Adriana Quinby 12/31/2015 9:50 AM

## 2015-12-31 NOTE — Telephone Encounter (Signed)
Attempted to contact patient regarding FISH results from amniocentesis. Patient's adult son answered the phone and stated that patient was at work but that she informed him somebody from our office would be calling. He stated that there was not another number to reach her at that time, since she was at work but to call the same number again in the morning before 2 pm. I left message with him that I would return call to the patient tomorrow morning.   Clydie BraunKaren Ailin Rochford 12/31/2015 8:33 AM

## 2015-12-31 NOTE — Telephone Encounter (Signed)
Spoke with Dr. Estanislado Pandyivard regarding patient. Discussed that per my conversation with patient and patient's adult son this morning, the patient is planning to pursue termination of pregnancy in [place] given that the gestation is past the legal limit for termination of pregnancy in [place]. It is not known if she is planning to pursue induction of labor or D&E at this time.  Clydie BraunKaren Addylynn Balin 12/31/2015 11:29 AM

## 2015-12-31 NOTE — Telephone Encounter (Signed)
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  We again discussed the limitations of FISH and that final results are still pending and will be available in 1-2 weeks. She was counseled again that the prognosis for trisomy 6518 is very guarded. She understands that pregnancies with trisomy 4818 have a significantly increased risk for miscarriage and stillbirth. Of those that survive to term, ~90% of infants die during the first year of life. Although this condition is often labeled as lethal, Carolyn Bryan was counseled that ~5-8% of children do indeed survive, some well beyond a year of life. She asked for confirm that there is not a cure for this condition. We reviewed that while there are various medical treatments for the various medical effects in babies with Trisomy 18, the patient is correct in her understanding that there is not a cure for the chromosome condition.   We reviewed options for the pregnancy of continuing the pregnancy with expectant management versus termination of pregnancy. Carolyn Bryan clearly stated her desire to terminate the pregnancy, given the underlying diagnosis. Reviewed that the gestation is too far advanced in [place] for the termination of pregnancy to be performed. Carolyn Bryan verbalized understanding and asked about contact information for centers out of state. I provided Carolyn Bryan with various contact numbers and names of centers in [place] and in [place]. Carolyn Bryan planned to contact these various centers and make an appointment. She planned to contact our office once an appointment was made so that we can fax medical records to the appropriate site. She inquired about informing her primary OB. We discussed that she could call her OB directly, or I can also communicate information to them, if she prefers. Carolyn Bryan planned to contact our office later today regarding where she plans to  pursue termination of pregnancy. All questions were answered to her satisfaction, she was encouraged to call with additional questions or concerns.  Quinn PlowmanKaren Sie Formisano, MS Patent attorneyCertified Genetic Counselor

## 2016-01-10 ENCOUNTER — Other Ambulatory Visit (HOSPITAL_COMMUNITY): Payer: Self-pay

## 2016-01-15 ENCOUNTER — Other Ambulatory Visit (HOSPITAL_COMMUNITY): Payer: Self-pay

## 2016-01-15 ENCOUNTER — Encounter (HOSPITAL_COMMUNITY): Payer: Self-pay

## 2016-01-17 ENCOUNTER — Encounter (HOSPITAL_COMMUNITY): Payer: Self-pay

## 2016-03-19 ENCOUNTER — Other Ambulatory Visit: Payer: Self-pay | Admitting: Family Medicine

## 2016-03-19 DIAGNOSIS — Z1231 Encounter for screening mammogram for malignant neoplasm of breast: Secondary | ICD-10-CM

## 2016-03-26 ENCOUNTER — Ambulatory Visit
Admission: RE | Admit: 2016-03-26 | Discharge: 2016-03-26 | Disposition: A | Discharge status: Home / Self Care | Payer: BLUE CROSS/BLUE SHIELD | Source: Ambulatory Visit | Attending: Family Medicine | Admitting: Family Medicine

## 2016-03-26 DIAGNOSIS — Z1231 Encounter for screening mammogram for malignant neoplasm of breast: Secondary | ICD-10-CM

## 2016-07-30 ENCOUNTER — Encounter (HOSPITAL_COMMUNITY): Payer: Self-pay

## 2017-02-18 ENCOUNTER — Other Ambulatory Visit: Payer: Self-pay | Admitting: Family Medicine

## 2017-02-19 ENCOUNTER — Other Ambulatory Visit: Payer: Self-pay | Admitting: Family Medicine

## 2017-02-19 DIAGNOSIS — N6459 Other signs and symptoms in breast: Secondary | ICD-10-CM

## 2017-02-19 DIAGNOSIS — N63 Unspecified lump in unspecified breast: Secondary | ICD-10-CM

## 2017-03-18 IMAGING — CR DG CHEST 2V
2 series · 2 of 2 positions shown · non-contrast
Comparison: None.

CLINICAL DATA: Acute chest pain.

EXAM:
CHEST  2 VIEW

[chest pa]
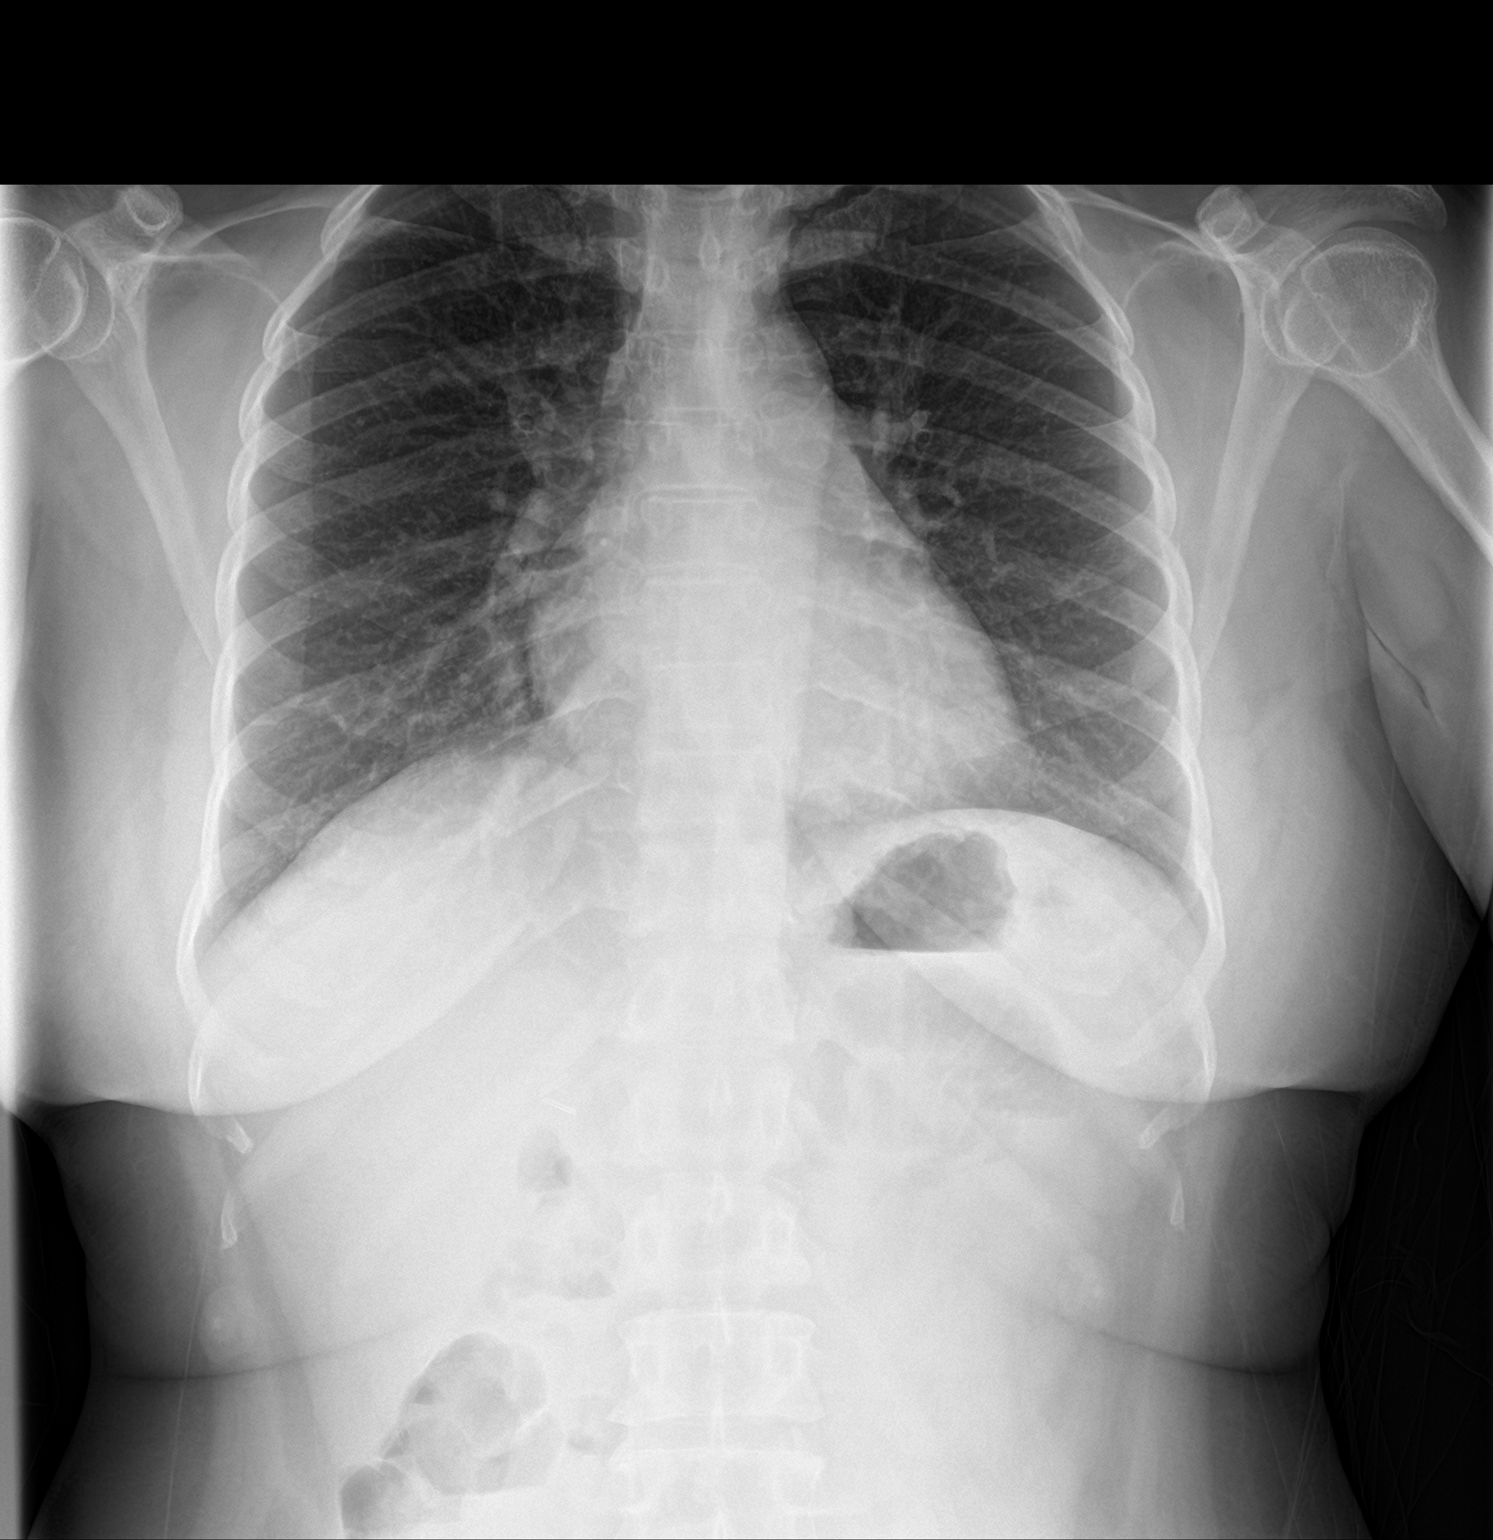

[chest lat]
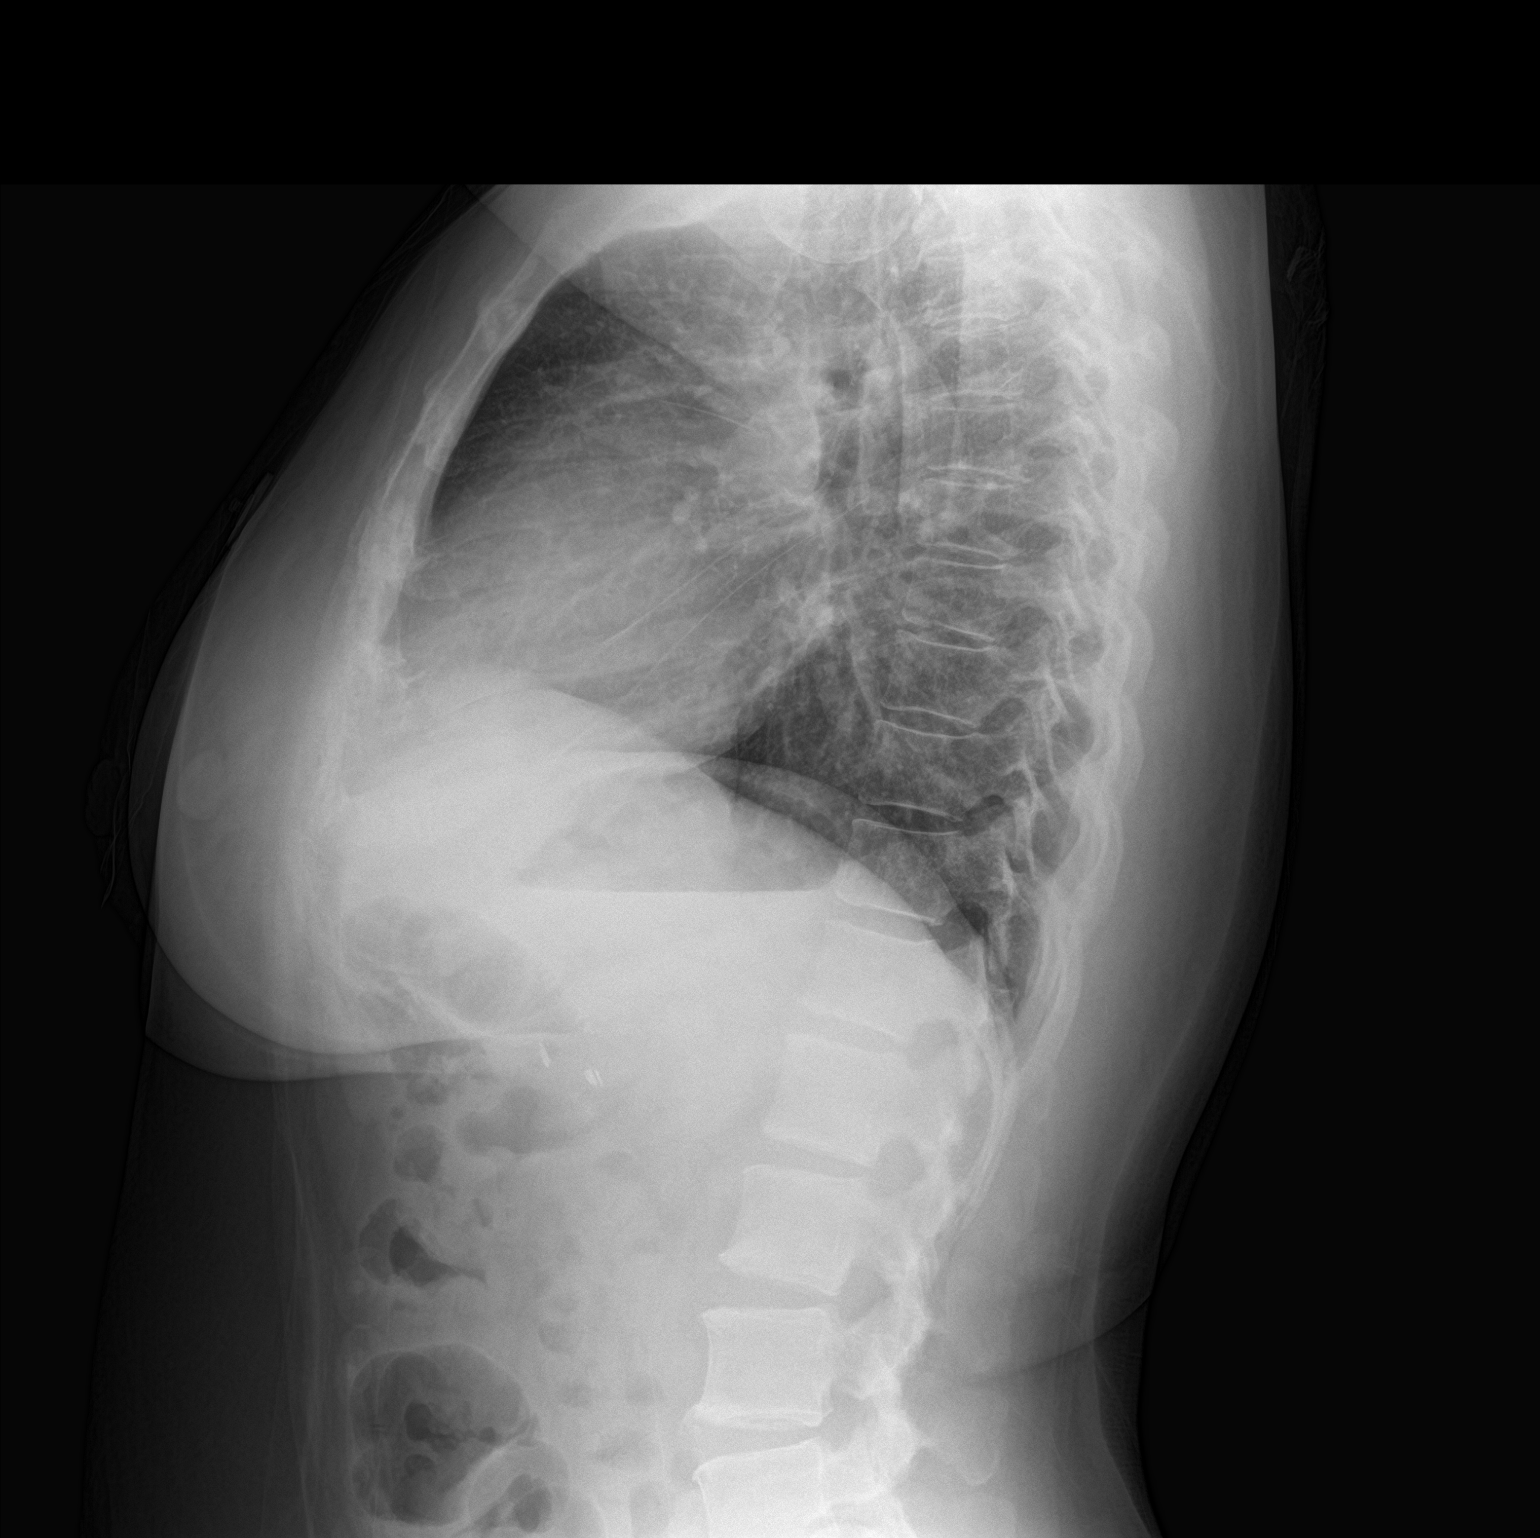

[2 of 2 positions shown; findings below may reference images not displayed]

FINDINGS: The heart size and mediastinal contours are within normal limits.
Both lungs are clear. No pneumothorax or pleural effusion is noted.
The visualized skeletal structures are unremarkable.
IMPRESSION: No active cardiopulmonary disease.

## 2017-03-18 IMAGING — CR DG THORACIC SPINE 2V
3 series · 3 of 3 positions shown · non-contrast
Comparison: None.

CLINICAL DATA: Back and flank pain since 6 a.m. this morning. No
known injury. Initial encounter.

EXAM:
THORACIC SPINE 2 VIEWS

[t-spine ap]
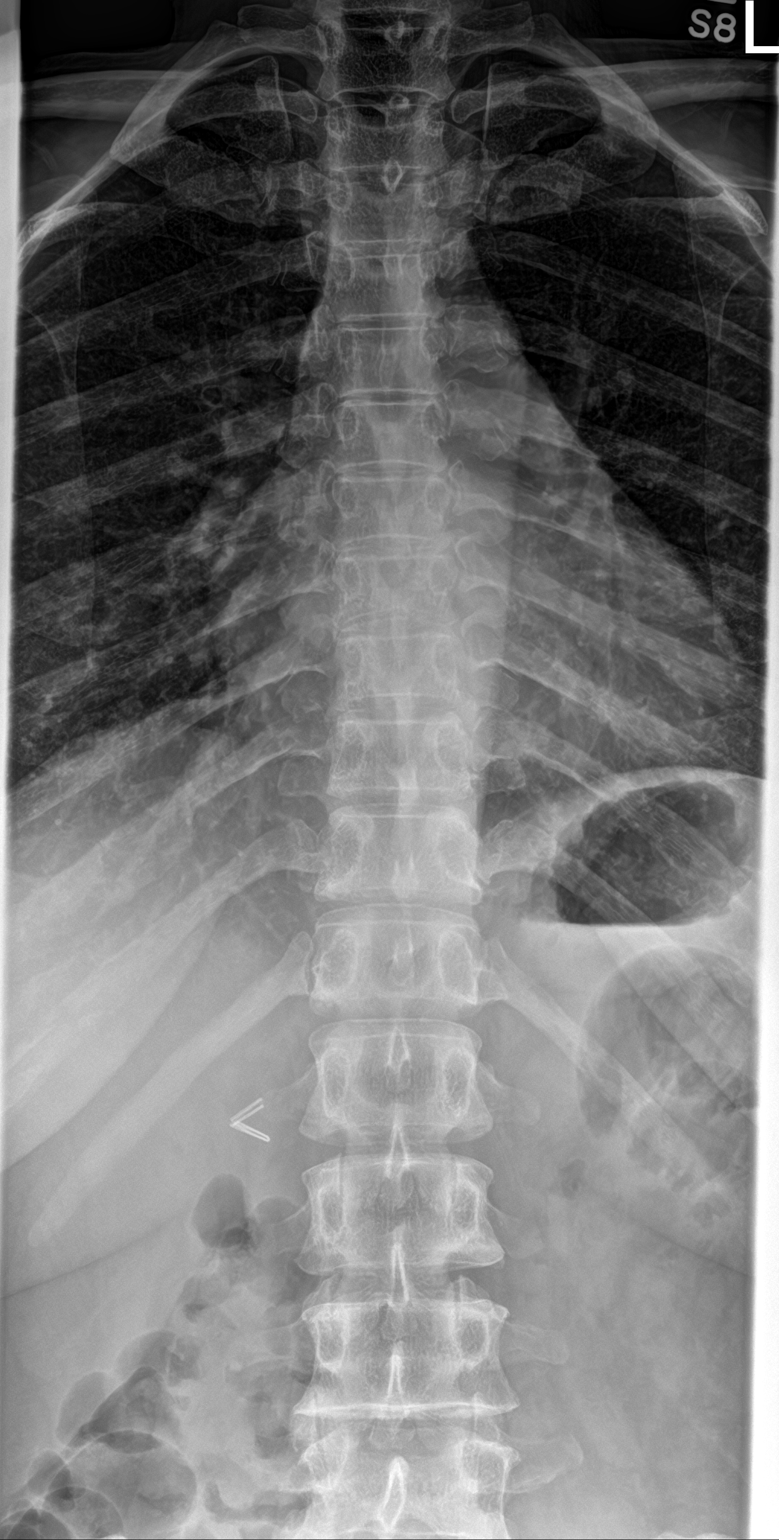

[t-spine lat]
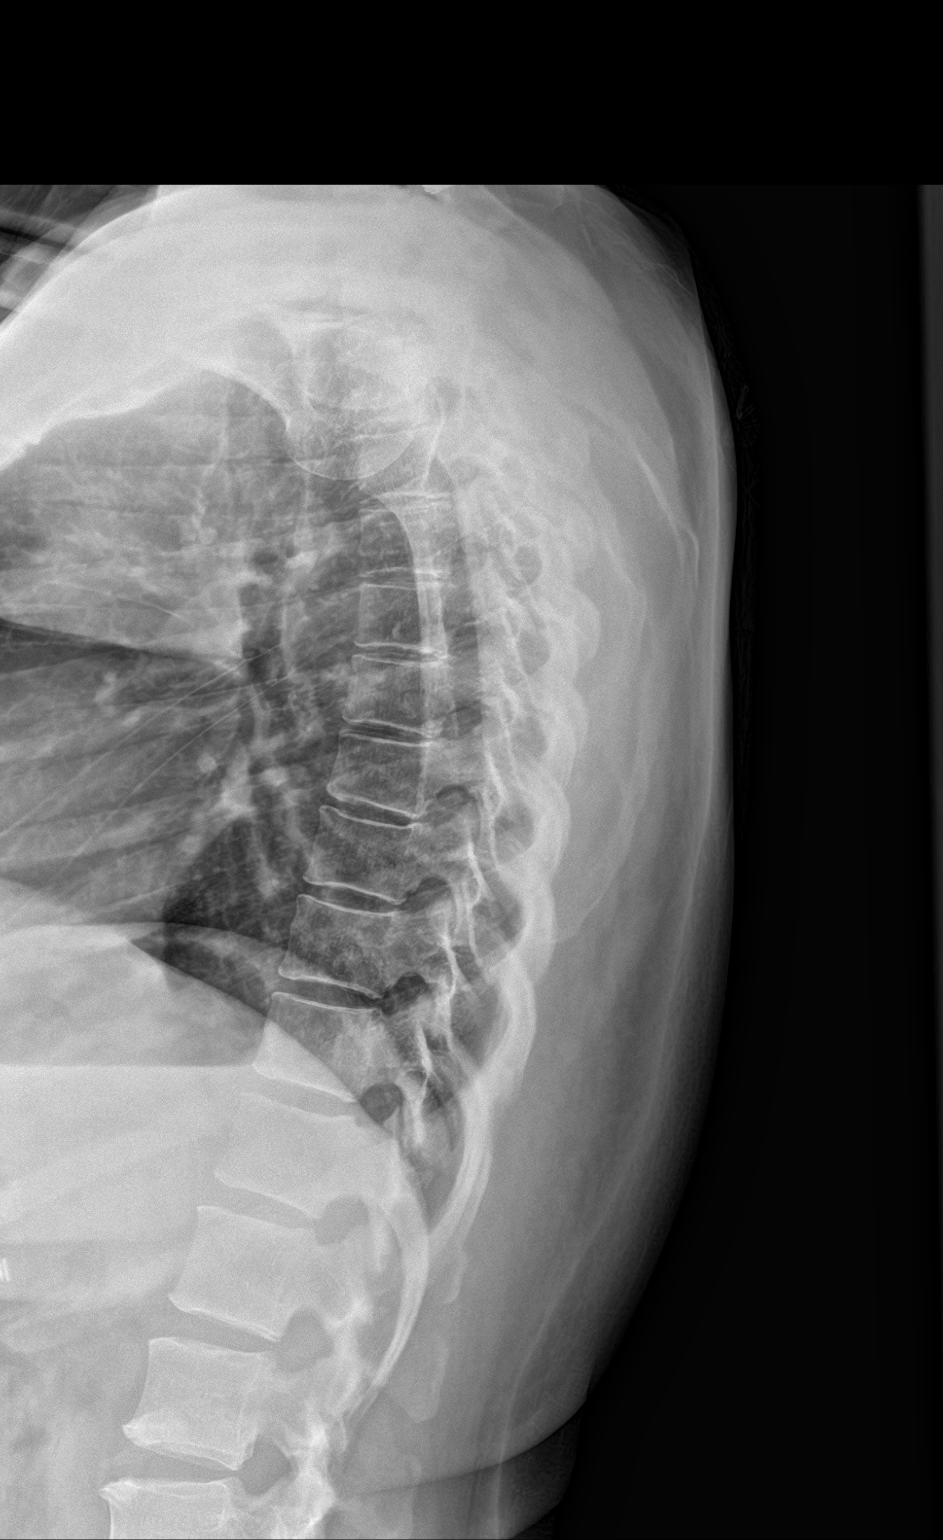

[t-spine swimmers]
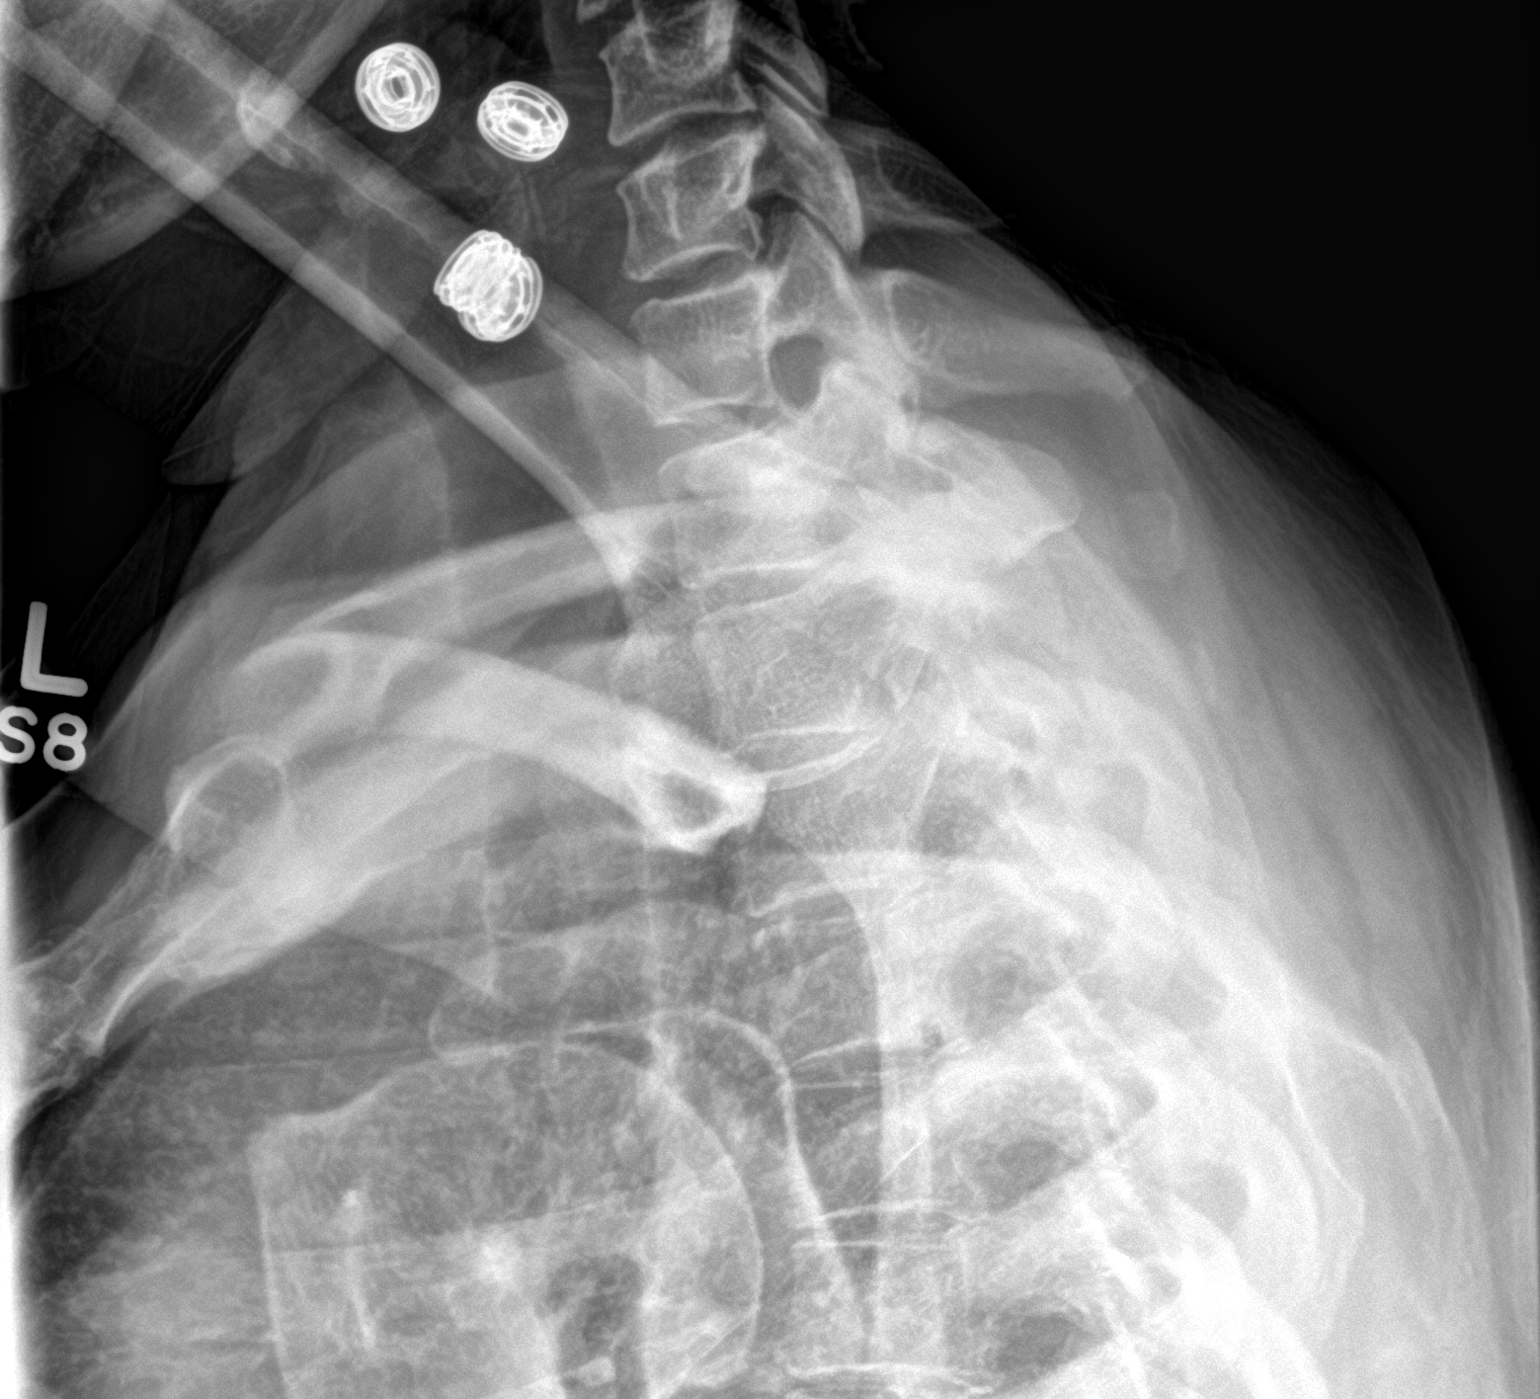

[3 of 3 positions shown; findings below may reference images not displayed]

FINDINGS: There is no evidence of thoracic spine fracture. Alignment is
normal. No other significant bone abnormalities are identified.
IMPRESSION: Negative exam.

## 2017-04-21 ENCOUNTER — Other Ambulatory Visit: Payer: Self-pay | Admitting: Family Medicine

## 2017-04-21 ENCOUNTER — Ambulatory Visit
Admission: RE | Admit: 2017-04-21 | Discharge: 2017-04-21 | Disposition: A | Discharge status: Home / Self Care | Payer: BLUE CROSS/BLUE SHIELD | Source: Ambulatory Visit | Attending: Family Medicine | Admitting: Family Medicine

## 2017-04-21 ENCOUNTER — Ambulatory Visit: Payer: BLUE CROSS/BLUE SHIELD

## 2017-04-21 DIAGNOSIS — N63 Unspecified lump in unspecified breast: Secondary | ICD-10-CM

## 2017-04-21 DIAGNOSIS — N6459 Other signs and symptoms in breast: Secondary | ICD-10-CM

## 2017-04-21 DIAGNOSIS — R599 Enlarged lymph nodes, unspecified: Secondary | ICD-10-CM

## 2017-04-26 ENCOUNTER — Ambulatory Visit
Admission: RE | Admit: 2017-04-26 | Discharge: 2017-04-26 | Disposition: A | Discharge status: Home / Self Care | Payer: BLUE CROSS/BLUE SHIELD | Source: Ambulatory Visit | Attending: Family Medicine | Admitting: Family Medicine

## 2017-04-26 ENCOUNTER — Other Ambulatory Visit: Payer: Self-pay | Admitting: Family Medicine

## 2017-04-26 DIAGNOSIS — R599 Enlarged lymph nodes, unspecified: Secondary | ICD-10-CM

## 2017-07-25 IMAGING — US US MFM AMNIOCENTESIS
1 of 2 series · 13 of 28 positions shown · non-contrast
Comparison: none

[Series 1: us mfm amniocentesis · 109 acquisitions, 13 frames shown]
[im 5/109]
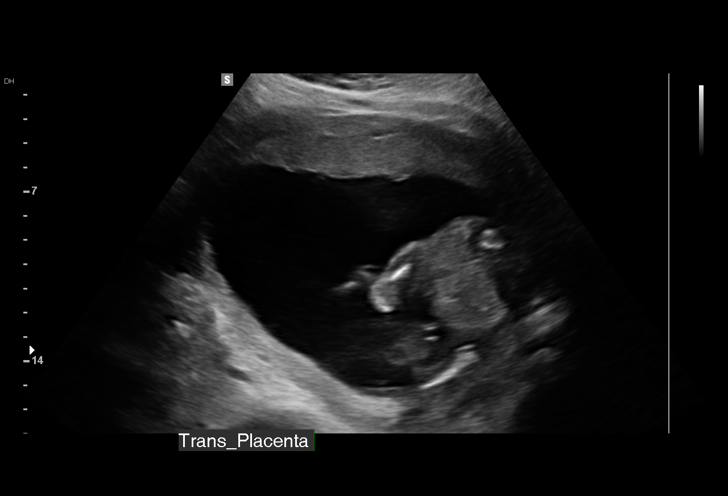
[im 13/109]
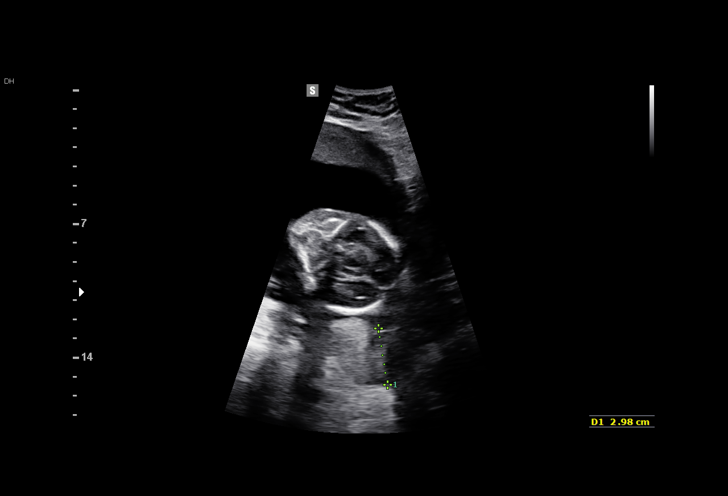
[im 21/109]
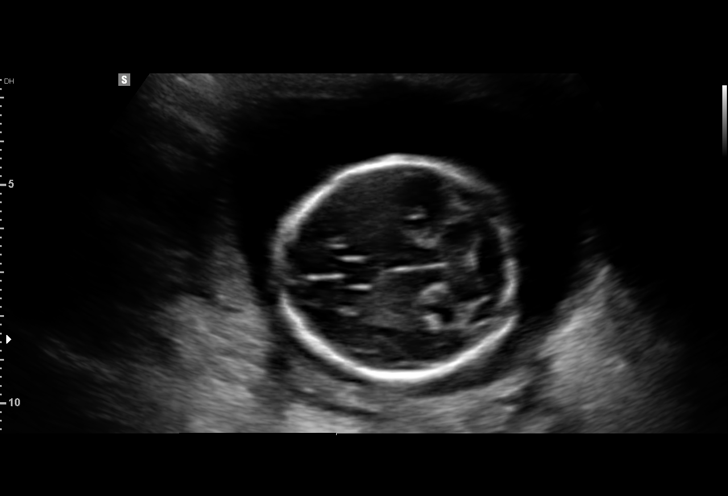
[im 30/109]
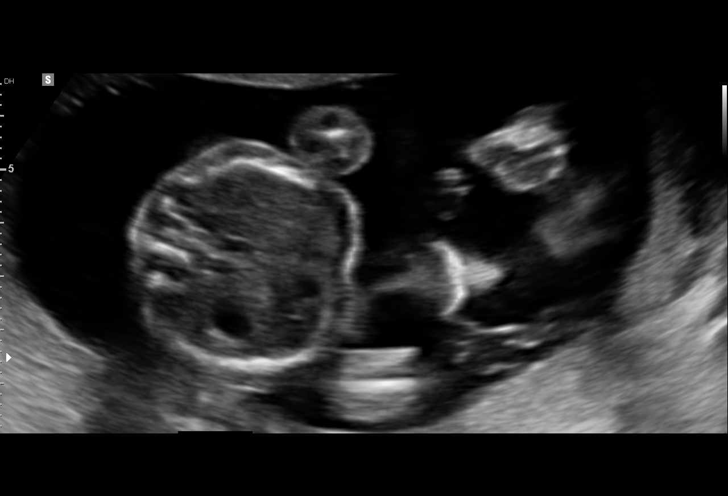
[im 38/109]
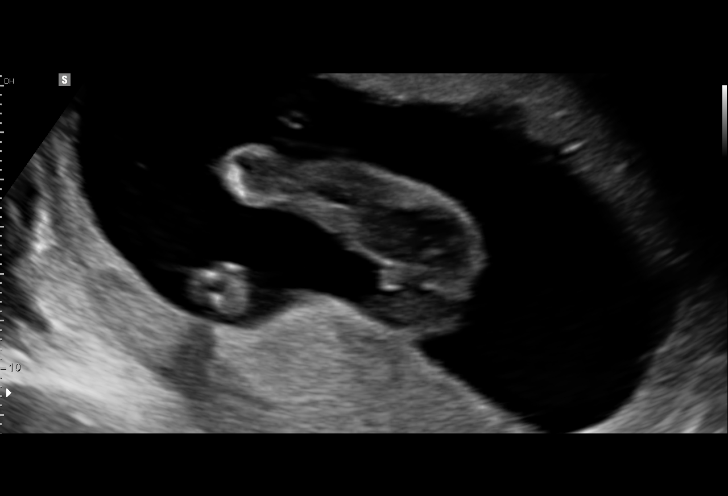
[im 46/109]
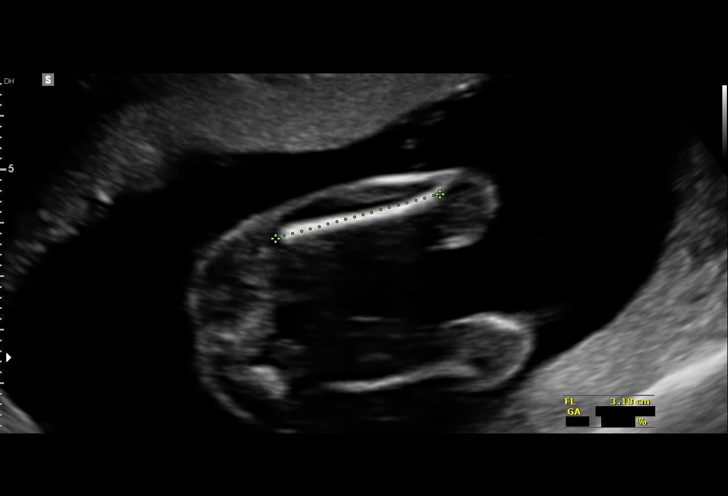
[im 59/109]
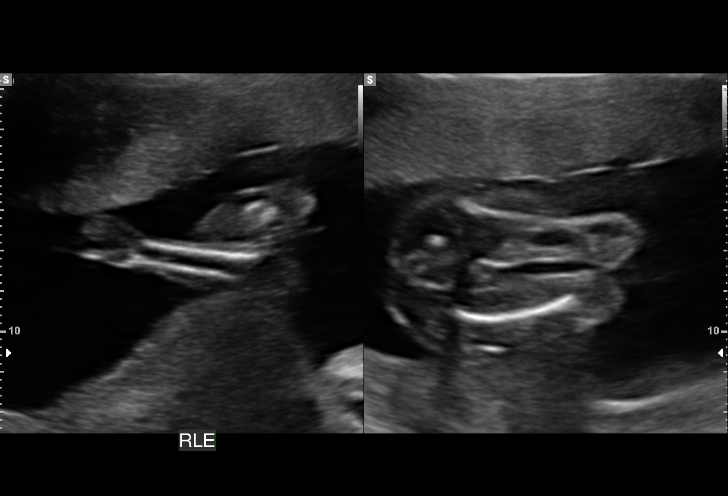
[im 67/109]
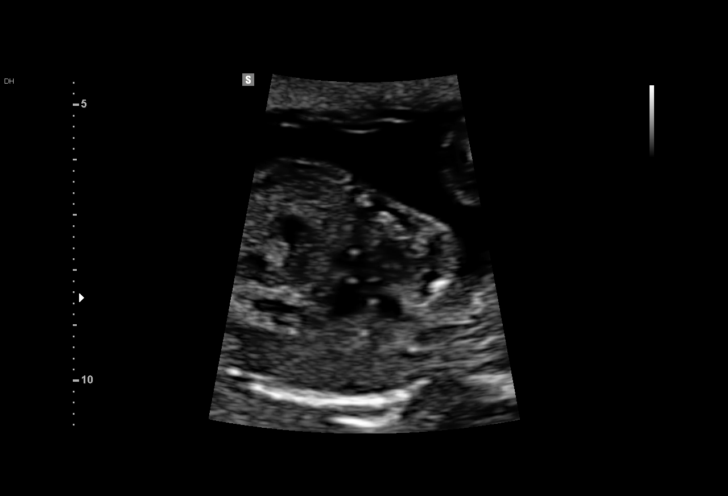
[im 75/109]
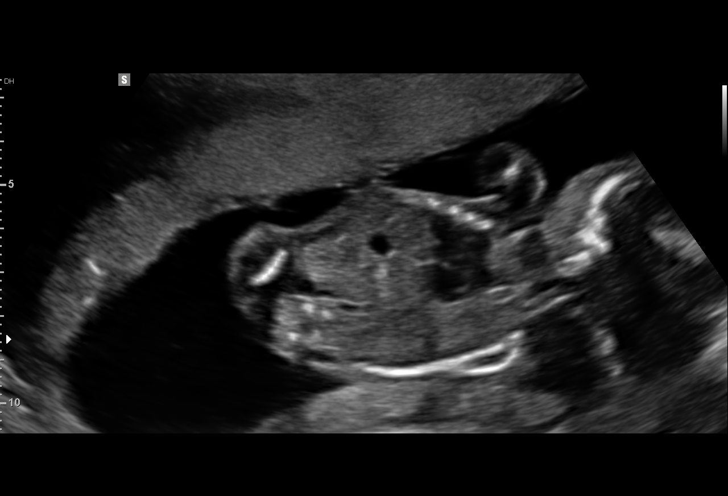
[im 84/109]
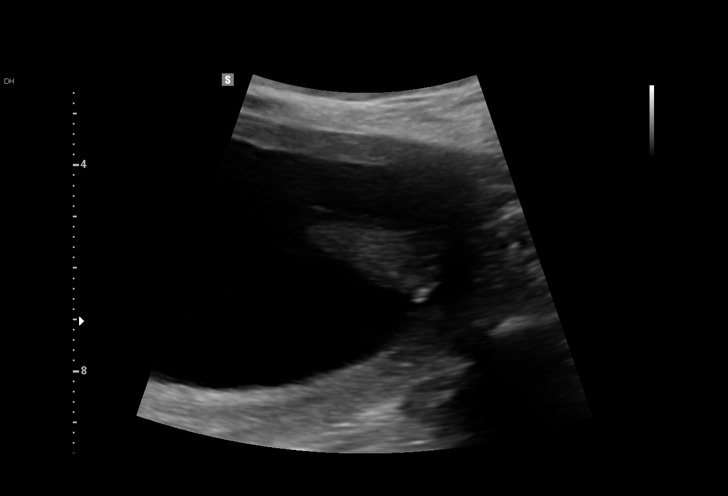
[im 92/109]
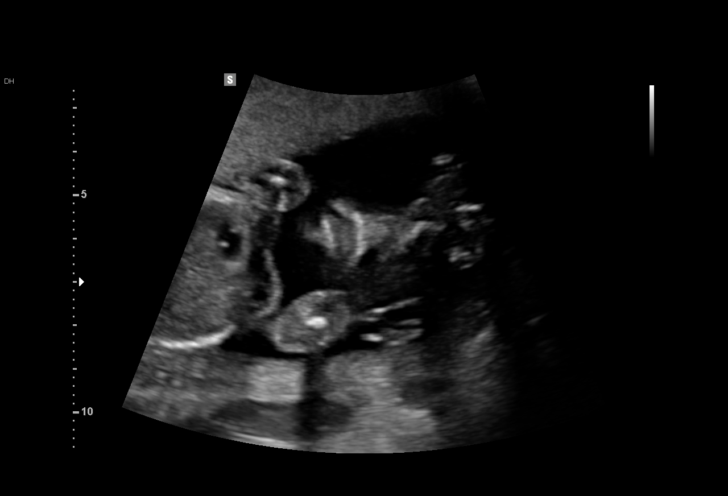
[im 100/109]
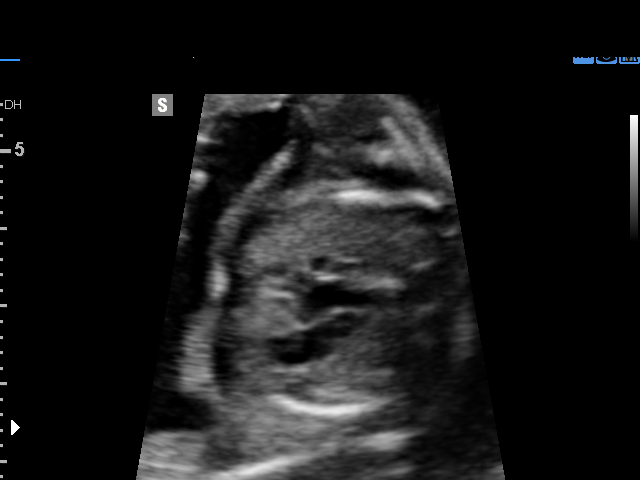
[im 109/109]
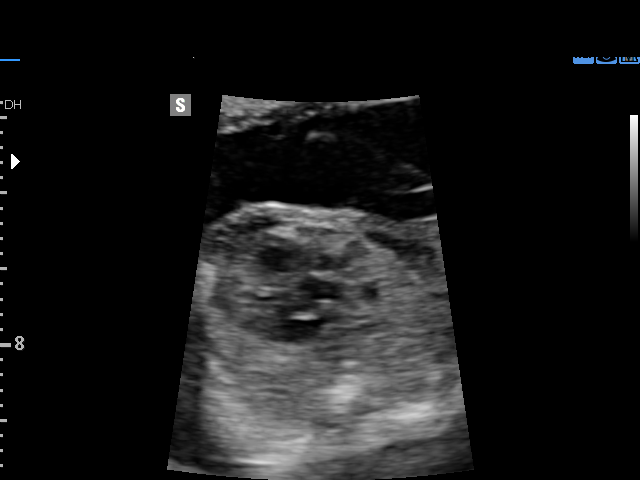

[13 of 28 positions shown; findings below may reference images not displayed]

Obstetrics &
Gynecology
[address]

1  GEOVANNI GON            [id number]      [id number]     [id number]
2  GEOVANNI GON            [id number]      [id number]     [id number]
Indications

21 weeks gestation of pregnancy
Small for gestational age fetus affecting
management of mother
Fetal abnormality - other known or
suspected (Dandy Walker Variant)
Advanced maternal age multigravida 35+
(44), second trimester
Detailed fetal anatomic survey                 Z36
OB History

Gravidity:    7         Term:   4        Prem:   0        SAB:   3
TOP:          0       Ectopic:  0        Living: 4
Fetal Evaluation

Num Of Fetuses:     1
Fetal Heart         142
Rate(bpm):
Cardiac Activity:   Observed
Presentation:       Cephalic
Placenta:           Anterior, above cervical os
P. Cord Insertion:  Visualized
Amniotic Fluid
AFI FV:      Subjectively within normal limits

Largest Pocket(cm)
7.0
Biometry

BPD:      48.8  mm     G. Age:  20w 5d         39  %    CI:         77.8   %   70 - 86
FL/HC:      18.2   %   15.9 -
HC:      175.1  mm     G. Age:  20w 0d          8  %    HC/AC:      1.20       1.06 -
AC:      145.9  mm     G. Age:  19w 6d         14  %    FL/BPD:     65.4   %
FL:       31.9  mm     G. Age:  20w 0d         12  %    FL/AC:      21.9   %   20 - 24
HUM:      29.7  mm     G. Age:  19w 5d         13  %
CER:      20.7  mm     G. Age:  19w 5d         21  %

Est. FW:     323  gm    0 lb 11 oz      23  %
Gestational Age

U/S Today:     20w 1d                                        EDD:   05/14/16
Best:          21w 0d    Det. By:   Early Ultrasound         EDD:   05/08/16
(09/25/15)
Anatomy

Cranium:               Abnormal, see          Diaphragm:              Appears normal
comments
Cavum:                 Appears normal         Stomach:                Appears normal, left
sided
Ventricles:            Appears normal         Abdomen:                Abnormal, see
comments
Choroid Plexus:        Bilateral choroid      Abdominal Wall:         Appears nml (cord
plexus cysts
insert, abd wall)
Cerebellum:            Appears normal         Cord Vessels:           Appears normal (3
vessel cord)
Posterior Fossa:       Appears normal         Kidneys:                Abnormal, see
comments
Nuchal Fold:           Not applicable (>20    Bladder:                Appears normal
wks GA)
Face:                  Abnormal, see          Spine:                  Appears normal
comments
Lips:                  Appears normal         Upper Extremities:      Visualized
Thoracic:              Appears normal         Lower Extremities:      Visualized
Heart:                 Abnormal, see
comments
Guided Procedures

Type:   Amniocentesis for fetal genetic study

FH Post Procedure:     Normal             RH Type:          A+
Rh Immune Globulin:    Not required,      Discharge Inst.:  Post-procedure
Rh positive                          instructions
given
Needle Insertions:     22 gauge x 1       Vol. Withdrawn:   20 ml of clear
amniotic fluid

Complications:  None

Comment:                    Amniocentesis performed after obtaining informed consent and performing  a "time-out"
Cervix Uterus Adnexa

Cervix
Length:              3  cm.
Normal appearance by transabdominal scan.

Uterus
Single fibroid noted, see table below.
Myomas

Site                     L(cm)      W(cm)      D(cm)      Location
Anterior right

Blood Flow                 RI        PI       Comments

Impression

SIUP at 21+0 weeks
Multiple abnormalities: brachycephaly, sloped forehead;
bilateral CP cysts; congenital heart defect (?TOF);
overlapping fingers (typical T18 positioning); unilateral renal
agenesis; rockerbottom feet
All other detailed fetal anatomy was seen and appeared
normal
High normal amniotic fluid volume
EFW at the 23rd %tile; AC at the 14th %tile

The US findings were shared with Ms. Hildarson. The implications
of these anomalies and trisomy 18  were discussed in detail.
She received genetic counseling. All of her prenatal testing
and pregnancy management options were reviewed. After
careful consideration, she decided to have an amniocentesis
which was performed without immediate complication. Blood
type: A+ She told us several times that she didn't want to
continue with the pregnancy if the baby had trisomy 18.

Today, FISH test showed trisomy 18. Mroczny Biarda her with
these results.
Recommendations

We can help her with names and numbers for centers in
other states
Or we can help follow her with you

## 2017-10-25 ENCOUNTER — Inpatient Hospital Stay
Admission: RE | Admit: 2017-10-25 | Discharge: 2017-10-25 | Disposition: A | Discharge status: Home / Self Care | Payer: BLUE CROSS/BLUE SHIELD | Source: Ambulatory Visit | Attending: Family Medicine | Admitting: Family Medicine

## 2018-02-10 ENCOUNTER — Other Ambulatory Visit: Payer: Self-pay | Admitting: Family Medicine

## 2018-03-02 ENCOUNTER — Ambulatory Visit
Admission: RE | Admit: 2018-03-02 | Discharge: 2018-03-02 | Disposition: A | Discharge status: Home / Self Care | Payer: BLUE CROSS/BLUE SHIELD | Source: Ambulatory Visit | Attending: Family Medicine | Admitting: Family Medicine

## 2018-03-02 DIAGNOSIS — R599 Enlarged lymph nodes, unspecified: Secondary | ICD-10-CM

## 2018-04-21 ENCOUNTER — Other Ambulatory Visit: Payer: Self-pay | Admitting: Family Medicine

## 2018-04-21 DIAGNOSIS — Z1231 Encounter for screening mammogram for malignant neoplasm of breast: Secondary | ICD-10-CM

## 2018-05-03 ENCOUNTER — Other Ambulatory Visit: Payer: Self-pay | Admitting: Nurse Practitioner

## 2018-05-03 DIAGNOSIS — R109 Unspecified abdominal pain: Secondary | ICD-10-CM

## 2018-05-03 DIAGNOSIS — R102 Pelvic and perineal pain: Secondary | ICD-10-CM

## 2018-05-09 ENCOUNTER — Ambulatory Visit
Admission: RE | Admit: 2018-05-09 | Discharge: 2018-05-09 | Disposition: A | Discharge status: Home / Self Care | Payer: BLUE CROSS/BLUE SHIELD | Source: Ambulatory Visit | Attending: Nurse Practitioner | Admitting: Nurse Practitioner

## 2018-05-09 DIAGNOSIS — R109 Unspecified abdominal pain: Secondary | ICD-10-CM

## 2018-05-09 DIAGNOSIS — R102 Pelvic and perineal pain: Secondary | ICD-10-CM

## 2018-05-25 ENCOUNTER — Ambulatory Visit
Admission: RE | Admit: 2018-05-25 | Discharge: 2018-05-25 | Disposition: A | Discharge status: Home / Self Care | Payer: BLUE CROSS/BLUE SHIELD | Source: Ambulatory Visit | Attending: Family Medicine | Admitting: Family Medicine

## 2018-05-25 ENCOUNTER — Encounter: Payer: Self-pay | Admitting: Radiology

## 2018-05-25 DIAGNOSIS — Z1231 Encounter for screening mammogram for malignant neoplasm of breast: Secondary | ICD-10-CM

## 2022-11-07 ENCOUNTER — Emergency Department (HOSPITAL_COMMUNITY): Payer: BLUE CROSS/BLUE SHIELD

## 2022-11-07 ENCOUNTER — Emergency Department (HOSPITAL_COMMUNITY)
Admission: EM | Admit: 2022-11-07 | Discharge: 2022-11-07 | Disposition: A | Discharge status: Home / Self Care | Payer: BLUE CROSS/BLUE SHIELD | Attending: Student | Admitting: Student

## 2022-11-07 ENCOUNTER — Other Ambulatory Visit: Payer: Self-pay

## 2022-11-07 ENCOUNTER — Encounter (HOSPITAL_COMMUNITY): Payer: Self-pay | Admitting: *Deleted

## 2022-11-07 DIAGNOSIS — G8911 Acute pain due to trauma: Secondary | ICD-10-CM | POA: Diagnosis not present

## 2022-11-07 DIAGNOSIS — Y92410 Unspecified street and highway as the place of occurrence of the external cause: Secondary | ICD-10-CM | POA: Insufficient documentation

## 2022-11-07 DIAGNOSIS — M25512 Pain in left shoulder: Secondary | ICD-10-CM | POA: Diagnosis not present

## 2022-11-07 DIAGNOSIS — R0789 Other chest pain: Secondary | ICD-10-CM | POA: Insufficient documentation

## 2022-11-07 MED ORDER — IBUPROFEN 400 MG PO TABS: 400.0000 mg | ORAL_TABLET | Freq: Four times a day (QID) | ORAL | 0 refills | Status: AC | PRN

## 2022-11-07 MED ORDER — CYCLOBENZAPRINE HCL 5 MG PO TABS: 5.0000 mg | ORAL_TABLET | Freq: Two times a day (BID) | ORAL | 0 refills | Status: AC | PRN

## 2022-11-07 MED ORDER — IBUPROFEN 200 MG PO TABS
600.0000 mg | ORAL_TABLET | Freq: Once | ORAL | Status: AC
  Administered 2022-11-07: 600 mg via ORAL
  Filled 2022-11-07: qty 3

## 2022-11-07 NOTE — Discharge Instructions (Addendum)
Please follow-up with your primary care doctor.  Return to the ED if you have severe nausea, vomiting intractable headache, severe neck or back pain.  Weakness in your arms.  Take the muscle relaxer as needed, for your musculoskeletal pain however be careful when driving as this may cause drowsiness.
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
